# Patient Record
Sex: Female | Born: 1935 | Race: White | Hispanic: No
Health system: Southern US, Community
[De-identification: ages and names within clinical notes are randomized; demographics above are authoritative.]

---

## 2005-04-15 ENCOUNTER — Ambulatory Visit: Payer: Self-pay | Admitting: Internal Medicine

## 2005-04-23 ENCOUNTER — Ambulatory Visit: Payer: Self-pay | Admitting: Internal Medicine

## 2005-05-19 ENCOUNTER — Inpatient Hospital Stay: Payer: Self-pay | Admitting: Internal Medicine

## 2005-05-19 ENCOUNTER — Other Ambulatory Visit: Payer: Self-pay

## 2006-04-26 ENCOUNTER — Ambulatory Visit: Payer: Self-pay | Admitting: Internal Medicine

## 2009-02-20 ENCOUNTER — Ambulatory Visit: Payer: Self-pay | Admitting: Internal Medicine

## 2009-06-22 ENCOUNTER — Inpatient Hospital Stay: Payer: Self-pay | Admitting: Internal Medicine

## 2011-11-06 ENCOUNTER — Inpatient Hospital Stay: Payer: Self-pay | Admitting: Internal Medicine

## 2011-11-12 ENCOUNTER — Encounter: Payer: Self-pay | Admitting: Internal Medicine

## 2011-11-16 ENCOUNTER — Inpatient Hospital Stay: Payer: Self-pay | Admitting: Internal Medicine

## 2011-11-21 ENCOUNTER — Ambulatory Visit: Payer: Self-pay | Admitting: Urology

## 2011-11-28 ENCOUNTER — Encounter: Payer: Self-pay | Admitting: Internal Medicine

## 2011-12-29 ENCOUNTER — Encounter: Payer: Self-pay | Admitting: Internal Medicine

## 2012-01-29 ENCOUNTER — Encounter: Payer: Self-pay | Admitting: Internal Medicine

## 2012-01-29 ENCOUNTER — Inpatient Hospital Stay: Payer: Self-pay | Admitting: Internal Medicine

## 2012-01-29 ENCOUNTER — Ambulatory Visit: Payer: Self-pay | Admitting: Internal Medicine

## 2012-01-29 LAB — COMPREHENSIVE METABOLIC PANEL
Albumin: 3.3 g/dL — ABNORMAL LOW (ref 3.4–5.0)
Alkaline Phosphatase: 185 U/L — ABNORMAL HIGH (ref 50–136)
Anion Gap: 7 (ref 7–16)
Chloride: 100 mmol/L (ref 98–107)
Co2: 31 mmol/L (ref 21–32)
Creatinine: 0.89 mg/dL (ref 0.60–1.30)
EGFR (African American): >60
EGFR (Non-African Amer.): >60
Potassium: 4 mmol/L (ref 3.5–5.1)
SGOT(AST): 27 U/L (ref 15–37)
SGPT (ALT): 17 U/L
Sodium: 138 mmol/L (ref 136–145)
Total Protein: 7.4 g/dL (ref 6.4–8.2)

## 2012-01-29 LAB — CK TOTAL AND CKMB (NOT AT ARMC)
CK, Total: 20 U/L — ABNORMAL LOW (ref 21–215)
CK-MB: 0.6 ng/mL (ref 0.5–3.6)

## 2012-01-29 LAB — TROPONIN I: Troponin-I: <0.02 ng/mL

## 2012-01-29 LAB — URINALYSIS, COMPLETE
Bilirubin,UR: NEGATIVE
Blood: NEGATIVE
Ph: 8 (ref 4.5–8.0)
Protein: NEGATIVE
RBC,UR: 2 /HPF (ref 0–5)
Specific Gravity: 1.009 (ref 1.003–1.030)
Squamous Epithelial: 1
WBC UR: 34 /HPF (ref 0–5)

## 2012-01-29 LAB — CBC
HGB: 12.6 g/dL (ref 12.0–16.0)
MCH: 30 pg (ref 26.0–34.0)
MCHC: 33.1 g/dL (ref 32.0–36.0)
MCV: 91 fL (ref 80–100)
RBC: 4.2 10*6/uL (ref 3.80–5.20)

## 2012-01-30 ENCOUNTER — Ambulatory Visit: Payer: Self-pay | Admitting: Urology

## 2012-01-30 LAB — CBC WITH DIFFERENTIAL/PLATELET
Basophil #: 0 10*3/uL (ref 0.0–0.1)
Basophil %: 0.5 %
Eosinophil #: 0.2 10*3/uL (ref 0.0–0.7)
Eosinophil %: 3.6 %
HCT: 36.3 % (ref 35.0–47.0)
HGB: 12.1 g/dL (ref 12.0–16.0)
Lymphocyte #: 1.3 10*3/uL (ref 1.0–3.6)
Lymphocyte %: 19.3 %
MCH: 30.2 pg (ref 26.0–34.0)
MCHC: 33.4 g/dL (ref 32.0–36.0)
Monocyte #: 0.6 10*3/uL (ref 0.0–0.7)
Neutrophil %: 68 %
Platelet: 211 10*3/uL (ref 150–440)
RBC: 4.01 10*6/uL (ref 3.80–5.20)

## 2012-01-30 LAB — BASIC METABOLIC PANEL
Anion Gap: 8 (ref 7–16)
BUN: 20 mg/dL — ABNORMAL HIGH (ref 7–18)
Calcium, Total: 9 mg/dL (ref 8.5–10.1)
Chloride: 104 mmol/L (ref 98–107)
Creatinine: 0.72 mg/dL (ref 0.60–1.30)
EGFR (Non-African Amer.): >60
Glucose: 95 mg/dL (ref 65–99)
Osmolality: 286 (ref 275–301)
Potassium: 3.8 mmol/L (ref 3.5–5.1)
Sodium: 142 mmol/L (ref 136–145)

## 2012-01-30 LAB — FOLATE: Folic Acid: 13.8 ng/mL (ref 3.1–100.0)

## 2012-01-30 LAB — SEDIMENTATION RATE: Erythrocyte Sed Rate: 28 mm/hr (ref 0–30)

## 2012-01-31 LAB — COMPREHENSIVE METABOLIC PANEL
Albumin: 2.8 g/dL — ABNORMAL LOW (ref 3.4–5.0)
Anion Gap: 11 (ref 7–16)
BUN: 25 mg/dL — ABNORMAL HIGH (ref 7–18)
Co2: 29 mmol/L (ref 21–32)
EGFR (African American): >60
Glucose: 88 mg/dL (ref 65–99)
Osmolality: 294 (ref 275–301)
Potassium: 3.7 mmol/L (ref 3.5–5.1)
SGOT(AST): 21 U/L (ref 15–37)
Sodium: 146 mmol/L — ABNORMAL HIGH (ref 136–145)
Total Protein: 6.1 g/dL — ABNORMAL LOW (ref 6.4–8.2)

## 2012-01-31 LAB — CBC WITH DIFFERENTIAL/PLATELET
Basophil #: 0 10*3/uL (ref 0.0–0.1)
HCT: 35.3 % (ref 35.0–47.0)
HGB: 11.6 g/dL — ABNORMAL LOW (ref 12.0–16.0)
Lymphocyte #: 1.5 10*3/uL (ref 1.0–3.6)
Lymphocyte %: 25.2 %
MCHC: 32.9 g/dL (ref 32.0–36.0)
MCV: 90 fL (ref 80–100)
Monocyte %: 7.8 %
Neutrophil #: 3.6 10*3/uL (ref 1.4–6.5)
RBC: 3.92 10*6/uL (ref 3.80–5.20)
RDW: 14.8 % — ABNORMAL HIGH (ref 11.5–14.5)

## 2012-02-02 LAB — CBC WITH DIFFERENTIAL/PLATELET
Basophil %: 0.8 %
Eosinophil #: 0.5 10*3/uL (ref 0.0–0.7)
Eosinophil %: 7.4 %
HCT: 35.1 % (ref 35.0–47.0)
HGB: 11.6 g/dL — ABNORMAL LOW (ref 12.0–16.0)
Lymphocyte #: 1.6 10*3/uL (ref 1.0–3.6)
Lymphocyte %: 26.2 %
MCHC: 33.2 g/dL (ref 32.0–36.0)
MCV: 90 fL (ref 80–100)
Monocyte #: 0.6 10*3/uL (ref 0.0–0.7)
Neutrophil #: 3.5 10*3/uL (ref 1.4–6.5)
RBC: 3.9 10*6/uL (ref 3.80–5.20)

## 2012-02-02 LAB — APTT: Activated PTT: 42.1 secs — ABNORMAL HIGH (ref 23.6–35.9)

## 2012-02-02 LAB — PROTIME-INR: Prothrombin Time: 13.2 secs (ref 11.5–14.7)

## 2012-02-03 LAB — BASIC METABOLIC PANEL
Anion Gap: 8 (ref 7–16)
Chloride: 102 mmol/L (ref 98–107)
Co2: 32 mmol/L (ref 21–32)
Creatinine: 0.83 mg/dL (ref 0.60–1.30)
EGFR (Non-African Amer.): >60
Osmolality: 288 (ref 275–301)
Potassium: 3.5 mmol/L (ref 3.5–5.1)
Sodium: 142 mmol/L (ref 136–145)

## 2012-02-03 LAB — MAGNESIUM: Magnesium: 2.5 mg/dL — ABNORMAL HIGH

## 2012-02-03 LAB — APTT: Activated PTT: 54.7 secs — ABNORMAL HIGH (ref 23.6–35.9)

## 2012-02-04 LAB — HEMOGLOBIN: HGB: 11.3 g/dL — ABNORMAL LOW (ref 12.0–16.0)

## 2012-02-26 ENCOUNTER — Ambulatory Visit: Payer: Self-pay | Admitting: Internal Medicine

## 2013-09-29 ENCOUNTER — Inpatient Hospital Stay: Payer: Self-pay | Admitting: Internal Medicine

## 2013-09-29 LAB — COMPREHENSIVE METABOLIC PANEL
Albumin: 3.7 g/dL (ref 3.4–5.0)
Alkaline Phosphatase: 130 U/L (ref 50–136)
Bilirubin,Total: 0.8 mg/dL (ref 0.2–1.0)
Calcium, Total: 9.1 mg/dL (ref 8.5–10.1)
Co2: 30 mmol/L (ref 21–32)
Creatinine: 0.98 mg/dL (ref 0.60–1.30)
EGFR (Non-African Amer.): 49 — ABNORMAL LOW
SGPT (ALT): 23 U/L (ref 12–78)
Sodium: 142 mmol/L (ref 136–145)

## 2013-09-29 LAB — CBC
HGB: 14.5 g/dL (ref 12.0–16.0)
MCH: 32.9 pg (ref 26.0–34.0)
MCV: 94 fL (ref 80–100)
Platelet: 134 10*3/uL — ABNORMAL LOW (ref 150–440)
WBC: 4.9 10*3/uL (ref 3.6–11.0)

## 2013-09-29 LAB — URINALYSIS, COMPLETE
Bilirubin,UR: NEGATIVE
Blood: NEGATIVE
Glucose,UR: NEGATIVE mg/dL (ref 0–75)
Ketone: NEGATIVE
Protein: 100
RBC,UR: 3 /HPF (ref 0–5)
Specific Gravity: 1.018 (ref 1.003–1.030)
Squamous Epithelial: NONE SEEN

## 2013-09-29 LAB — TROPONIN I: Troponin-I: <0.02 ng/mL

## 2013-09-30 LAB — CBC WITH DIFFERENTIAL/PLATELET
Eosinophil %: 1.6 %
HGB: 13.4 g/dL (ref 12.0–16.0)
Lymphocyte %: 22.3 %
Monocyte #: 0.5 x10 3/mm (ref 0.2–0.9)
Neutrophil %: 65.6 %
Platelet: 124 10*3/uL — ABNORMAL LOW (ref 150–440)
RBC: 4.13 10*6/uL (ref 3.80–5.20)
WBC: 5 10*3/uL (ref 3.6–11.0)

## 2013-09-30 LAB — BASIC METABOLIC PANEL
Anion Gap: 4 — ABNORMAL LOW (ref 7–16)
Creatinine: 1.08 mg/dL (ref 0.60–1.30)
Osmolality: 285 (ref 275–301)
Sodium: 142 mmol/L (ref 136–145)

## 2013-10-01 LAB — BASIC METABOLIC PANEL
Anion Gap: 2 — ABNORMAL LOW (ref 7–16)
Chloride: 104 mmol/L (ref 98–107)
Creatinine: 1.05 mg/dL (ref 0.60–1.30)
Glucose: 97 mg/dL (ref 65–99)
Potassium: 3.8 mmol/L (ref 3.5–5.1)
Sodium: 138 mmol/L (ref 136–145)

## 2013-10-01 LAB — CBC WITH DIFFERENTIAL/PLATELET
Basophil #: 0 10*3/uL (ref 0.0–0.1)
Basophil %: 0.6 %
Eosinophil #: 0.1 10*3/uL (ref 0.0–0.7)
Lymphocyte #: 1.3 10*3/uL (ref 1.0–3.6)
Lymphocyte %: 21.9 %
MCHC: 34.8 g/dL (ref 32.0–36.0)
Monocyte %: 9.3 %
Neutrophil #: 3.9 10*3/uL (ref 1.4–6.5)
Neutrophil %: 66.3 %
Platelet: 126 10*3/uL — ABNORMAL LOW (ref 150–440)
RDW: 13.1 % (ref 11.5–14.5)
WBC: 5.8 10*3/uL (ref 3.6–11.0)

## 2013-10-01 LAB — URINE CULTURE

## 2013-10-02 LAB — URINALYSIS, COMPLETE
Bacteria: NONE SEEN
Glucose,UR: NEGATIVE mg/dL (ref 0–75)
Hyaline Cast: 15
Ketone: NEGATIVE
Protein: 100
RBC,UR: 3620 /HPF (ref 0–5)
Squamous Epithelial: <1

## 2013-10-02 LAB — BASIC METABOLIC PANEL
Calcium, Total: 9.2 mg/dL (ref 8.5–10.1)
Chloride: 101 mmol/L (ref 98–107)
Creatinine: 1.13 mg/dL (ref 0.60–1.30)
EGFR (Non-African Amer.): 42 — ABNORMAL LOW
Potassium: 3.5 mmol/L (ref 3.5–5.1)

## 2013-10-04 LAB — BASIC METABOLIC PANEL
Anion Gap: 5 — ABNORMAL LOW (ref 7–16)
BUN: 27 mg/dL — ABNORMAL HIGH (ref 7–18)
Calcium, Total: 9.4 mg/dL (ref 8.5–10.1)
Chloride: 103 mmol/L (ref 98–107)
Co2: 32 mmol/L (ref 21–32)
Creatinine: 0.98 mg/dL (ref 0.60–1.30)
EGFR (African American): 57 — ABNORMAL LOW
EGFR (Non-African Amer.): 49 — ABNORMAL LOW
Osmolality: 286 (ref 275–301)
Sodium: 140 mmol/L (ref 136–145)

## 2013-10-04 LAB — CBC WITH DIFFERENTIAL/PLATELET
Basophil #: 0 10*3/uL (ref 0.0–0.1)
Eosinophil %: 0.2 %
HCT: 40.3 % (ref 35.0–47.0)
MCHC: 35 g/dL (ref 32.0–36.0)
Monocyte #: 0.2 x10 3/mm (ref 0.2–0.9)
Neutrophil #: 3.6 10*3/uL (ref 1.4–6.5)
RBC: 4.33 10*6/uL (ref 3.80–5.20)
WBC: 4.4 10*3/uL (ref 3.6–11.0)

## 2013-10-04 LAB — CULTURE, BLOOD (SINGLE)

## 2013-10-05 ENCOUNTER — Ambulatory Visit: Payer: Self-pay | Admitting: Hospice and Palliative Medicine

## 2013-10-05 LAB — BASIC METABOLIC PANEL
Anion Gap: 4 — ABNORMAL LOW (ref 7–16)
BUN: 42 mg/dL — ABNORMAL HIGH (ref 7–18)
Calcium, Total: 9.3 mg/dL (ref 8.5–10.1)
Chloride: 103 mmol/L (ref 98–107)
Co2: 31 mmol/L (ref 21–32)
Creatinine: 1.1 mg/dL (ref 0.60–1.30)
EGFR (African American): 50 — ABNORMAL LOW
Glucose: 132 mg/dL — ABNORMAL HIGH (ref 65–99)
Osmolality: 288 (ref 275–301)
Potassium: 4 mmol/L (ref 3.5–5.1)

## 2013-10-05 LAB — CBC WITH DIFFERENTIAL/PLATELET
Basophil %: 0.1 %
Eosinophil #: 0 10*3/uL (ref 0.0–0.7)
Eosinophil %: 0 %
Lymphocyte %: 9.3 %
MCH: 32.5 pg (ref 26.0–34.0)
Neutrophil #: 5.4 10*3/uL (ref 1.4–6.5)
Neutrophil %: 83.8 %
Platelet: 141 10*3/uL — ABNORMAL LOW (ref 150–440)
RBC: 3.96 10*6/uL (ref 3.80–5.20)
WBC: 6.4 10*3/uL (ref 3.6–11.0)

## 2013-10-05 LAB — TROPONIN I: Troponin-I: <0.02 ng/mL

## 2013-10-05 LAB — CK TOTAL AND CKMB (NOT AT ARMC)
CK, Total: 20 U/L — ABNORMAL LOW (ref 21–215)
CK-MB: 1.5 ng/mL (ref 0.5–3.6)

## 2013-10-06 LAB — BASIC METABOLIC PANEL
Anion Gap: 5 — ABNORMAL LOW (ref 7–16)
Chloride: 103 mmol/L (ref 98–107)
Co2: 30 mmol/L (ref 21–32)
EGFR (African American): >60
EGFR (Non-African Amer.): 55 — ABNORMAL LOW
Glucose: 100 mg/dL — ABNORMAL HIGH (ref 65–99)
Osmolality: 284 (ref 275–301)
Sodium: 138 mmol/L (ref 136–145)

## 2013-10-07 LAB — CBC WITH DIFFERENTIAL/PLATELET
Basophil #: 0 10*3/uL (ref 0.0–0.1)
Basophil %: 0.2 %
Eosinophil %: 0.4 %
HCT: 39.3 % (ref 35.0–47.0)
Lymphocyte #: 0.6 10*3/uL — ABNORMAL LOW (ref 1.0–3.6)
MCHC: 35 g/dL (ref 32.0–36.0)
MCV: 93 fL (ref 80–100)
Monocyte #: 0.4 x10 3/mm (ref 0.2–0.9)
Neutrophil %: 79 %
RBC: 4.22 10*6/uL (ref 3.80–5.20)
RDW: 13.2 % (ref 11.5–14.5)
WBC: 5.2 10*3/uL (ref 3.6–11.0)

## 2013-10-07 LAB — BASIC METABOLIC PANEL
Chloride: 104 mmol/L (ref 98–107)
Creatinine: 0.99 mg/dL (ref 0.60–1.30)
EGFR (African American): 57 — ABNORMAL LOW
EGFR (Non-African Amer.): 49 — ABNORMAL LOW
Osmolality: 287 (ref 275–301)
Potassium: 4.3 mmol/L (ref 3.5–5.1)

## 2013-10-09 LAB — BASIC METABOLIC PANEL
Calcium, Total: 9.2 mg/dL (ref 8.5–10.1)
Chloride: 104 mmol/L (ref 98–107)
EGFR (Non-African Amer.): 53 — ABNORMAL LOW
Glucose: 111 mg/dL — ABNORMAL HIGH (ref 65–99)
Osmolality: 285 (ref 275–301)

## 2013-10-28 ENCOUNTER — Ambulatory Visit: Payer: Self-pay | Admitting: Nurse Practitioner

## 2013-11-27 ENCOUNTER — Ambulatory Visit: Payer: Self-pay | Admitting: Nurse Practitioner

## 2013-12-28 DEATH — deceased

## 2014-07-23 IMAGING — CR DG CHEST 1V PORT
1 series · 2 of 2 positions shown · non-contrast
Comparison: none

REASON FOR EXAM: difficulty breathing
COMMENTS:

[Series 1: ap · 0.17mm/px · 2 of 2 slices shown]
[im 1/2]
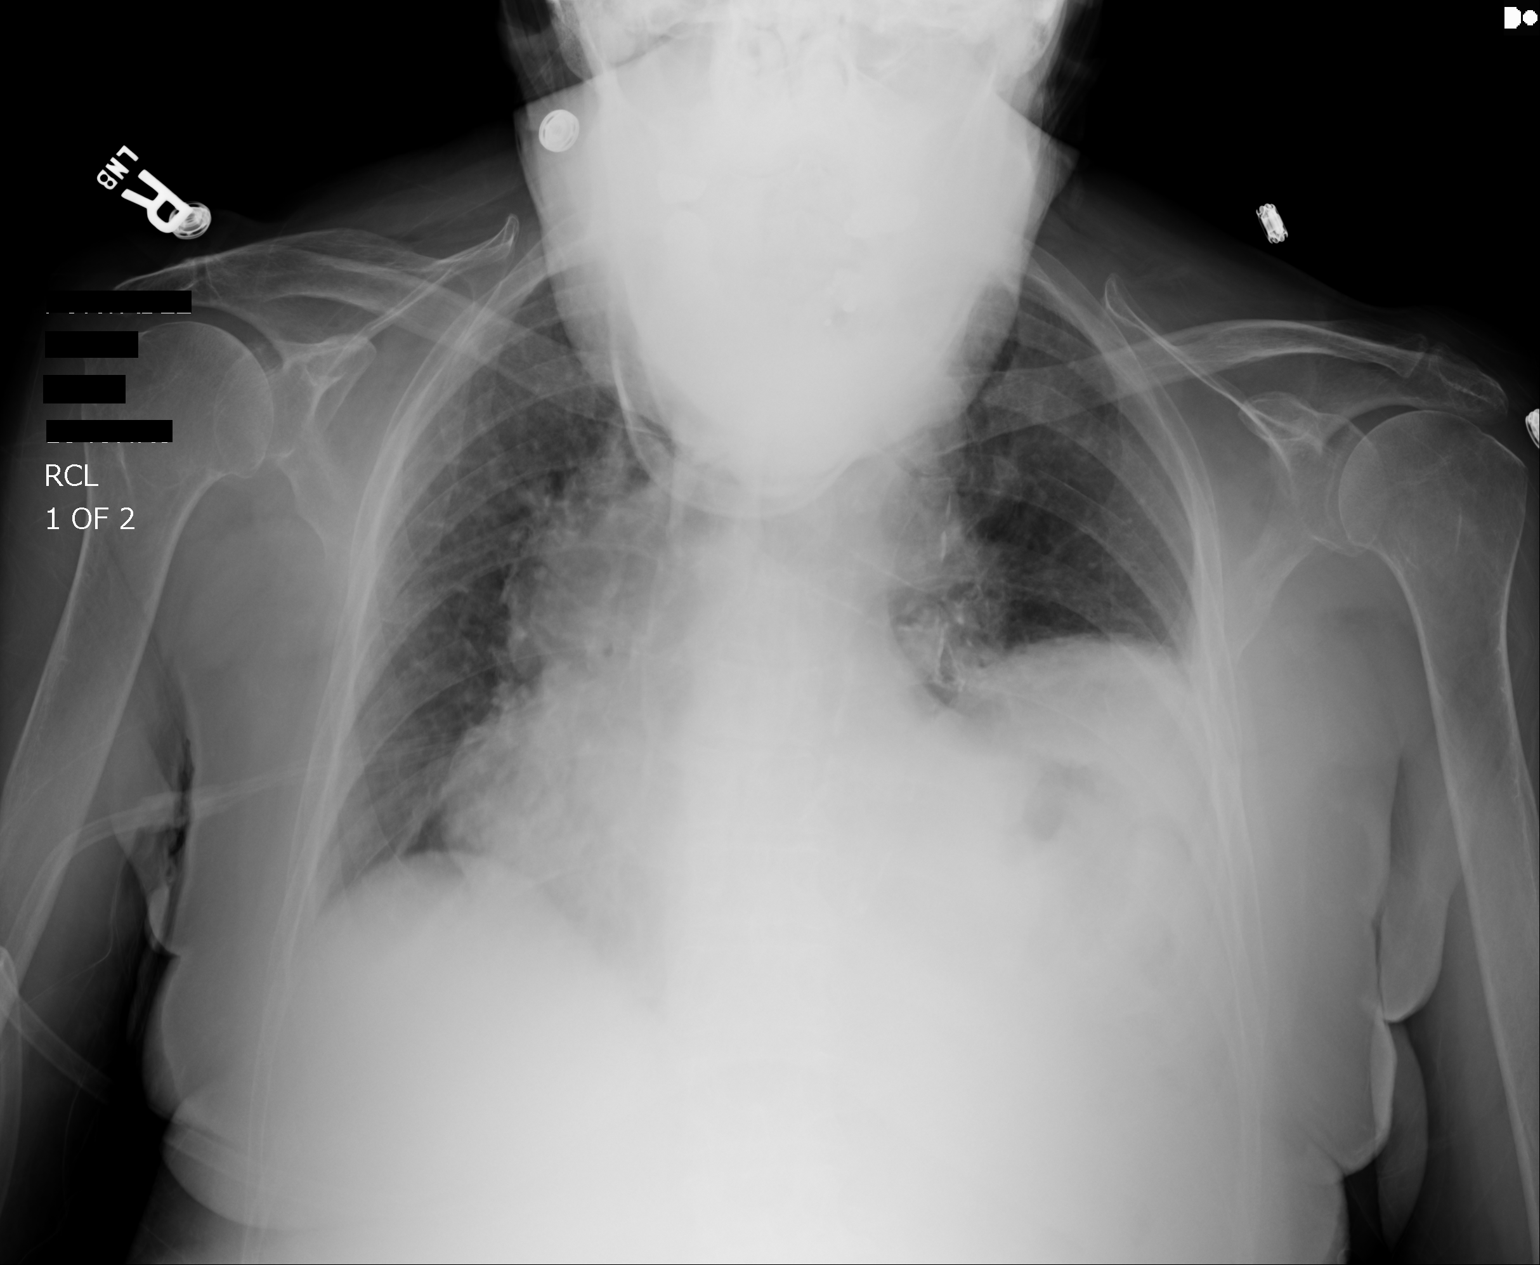
[im 2/2]
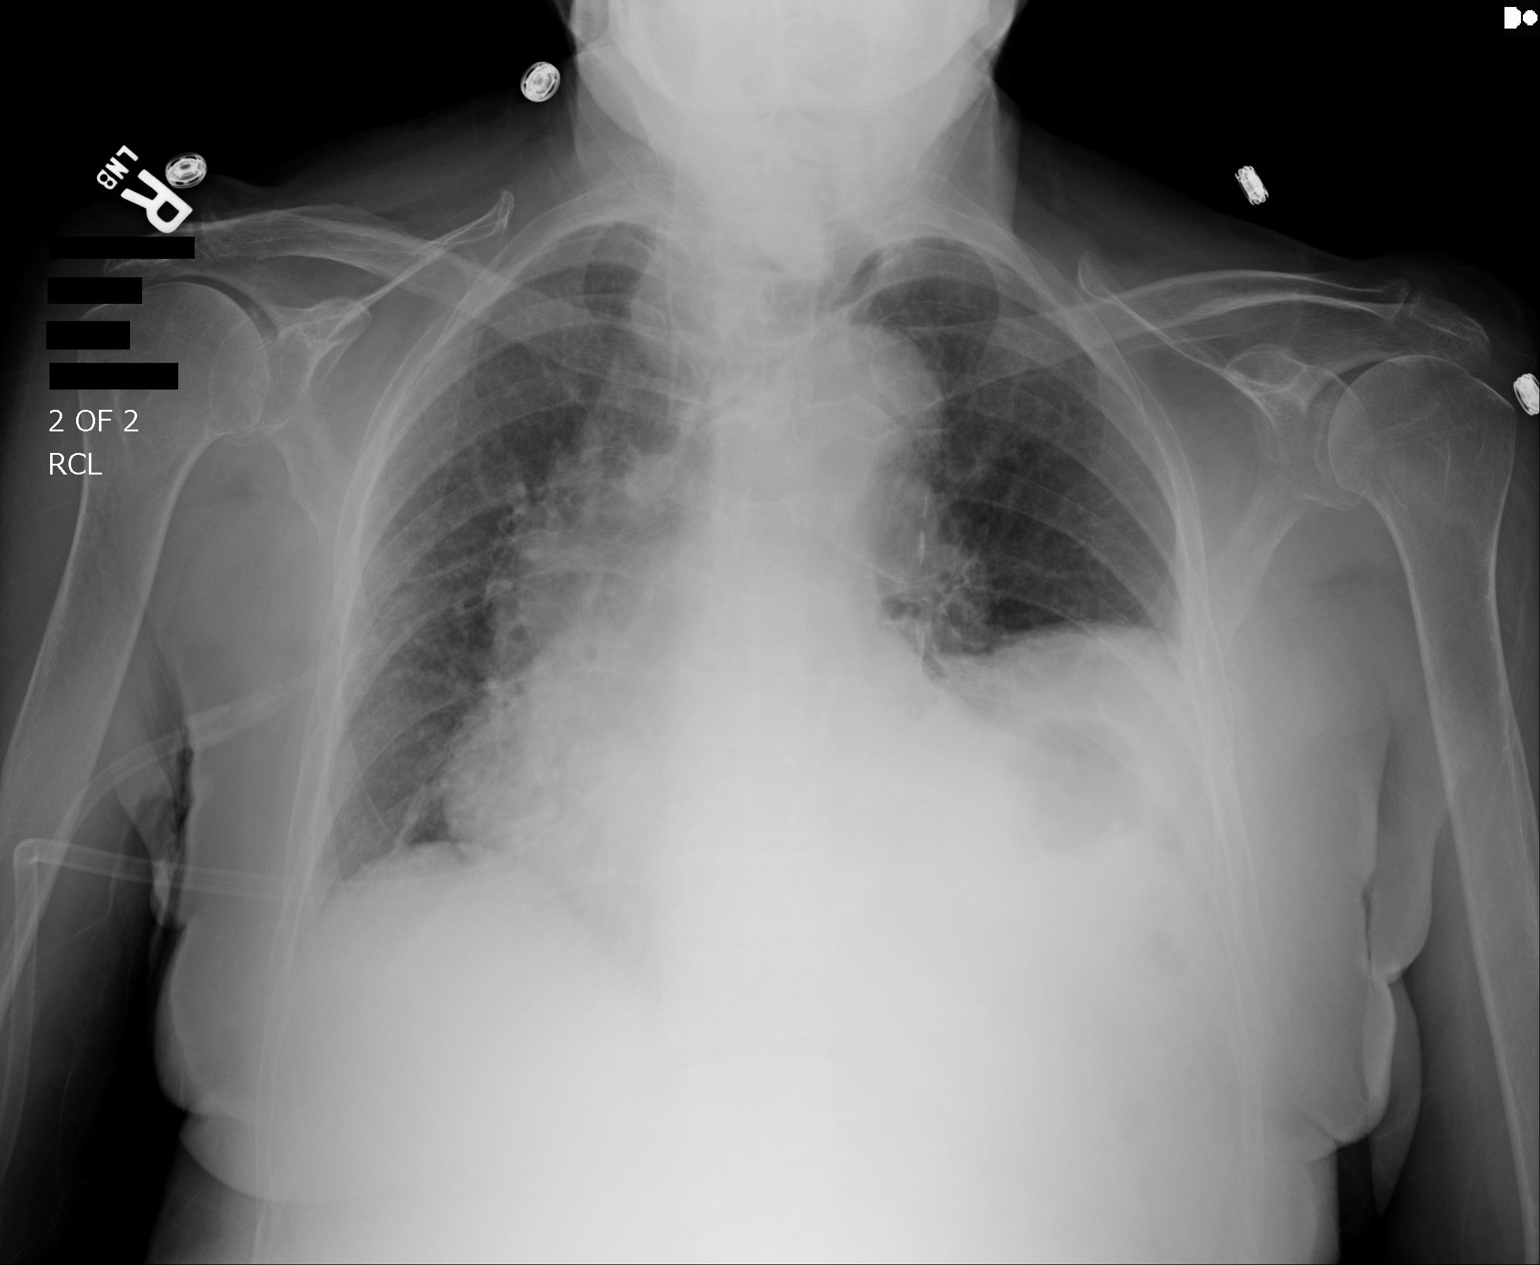

[2 of 2 positions shown; findings below may reference images not displayed]

PROCEDURE:     DXR - DXR PORTABLE CHEST SINGLE VIEW  - October 03, 2013  [DATE]

RESULT:     The chin projects over the lung apices and superior mediastinum.
There is elevation of the left side of the diaphragm. The heart is enlarged.
There is perihilar increased density bilaterally especially on the right. A
second image shows the chin is raised. There is no pneumothorax. There is no
large effusion. The bones are osteopenic.
IMPRESSION: 1. Cardiomegaly. Stable appearance. Elevation of the left side of the
diaphragm. Hilar fullness bilaterally.

[REDACTED]

## 2014-07-25 IMAGING — US US EXTREM LOW VENOUS BILAT
1 series · 14 of 24 positions shown · non-contrast
Comparison: none

REASON FOR EXAM: sob, hypoxia, ? dvt
COMMENTS:

[Series 1: us extrem low venous bilat · 0.10mm/px · 14 of 50 slices shown]
[im 1/50]
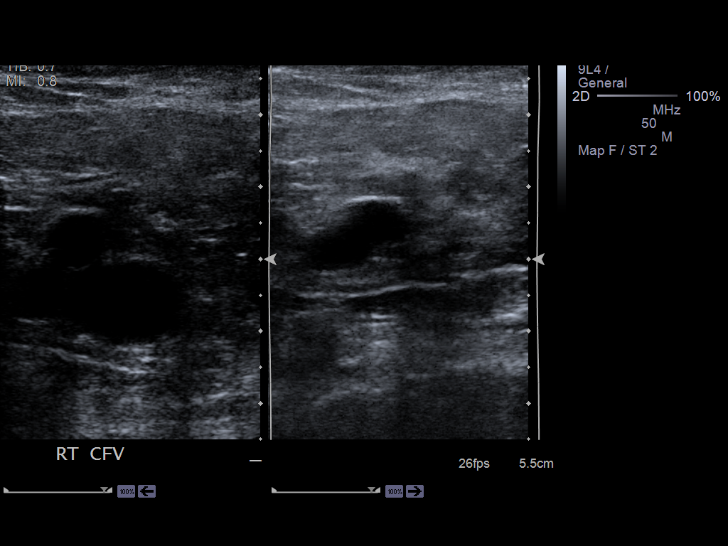
[im 5/50]
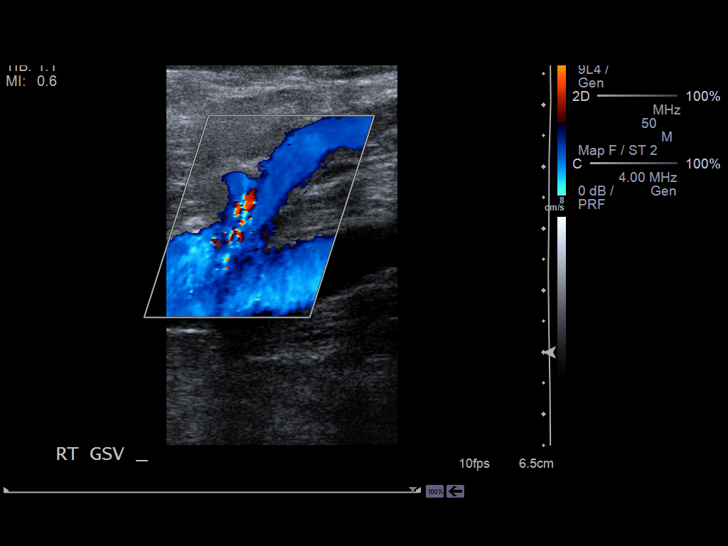
[im 9/50]
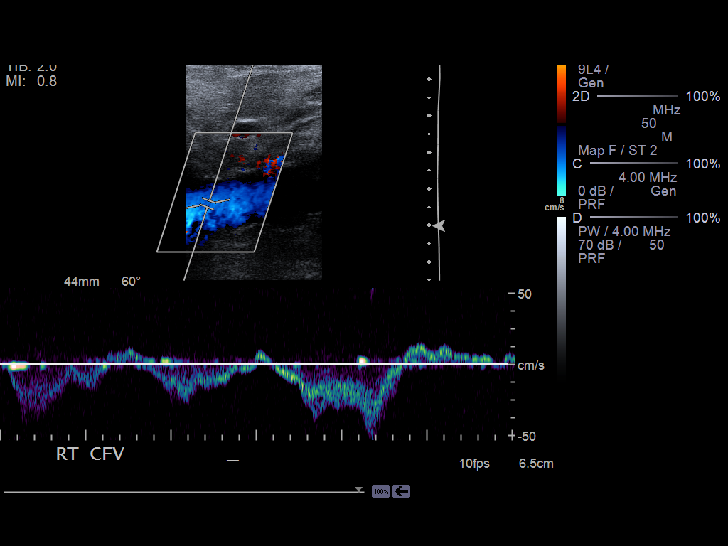
[im 13/50]
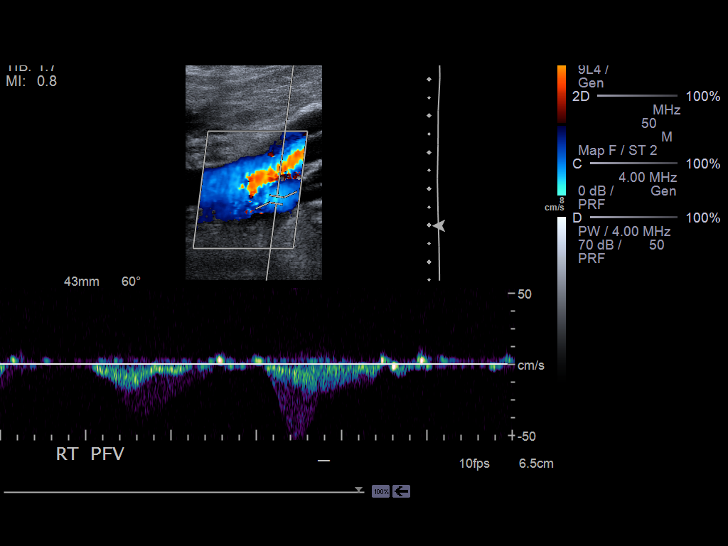
[im 15/50]
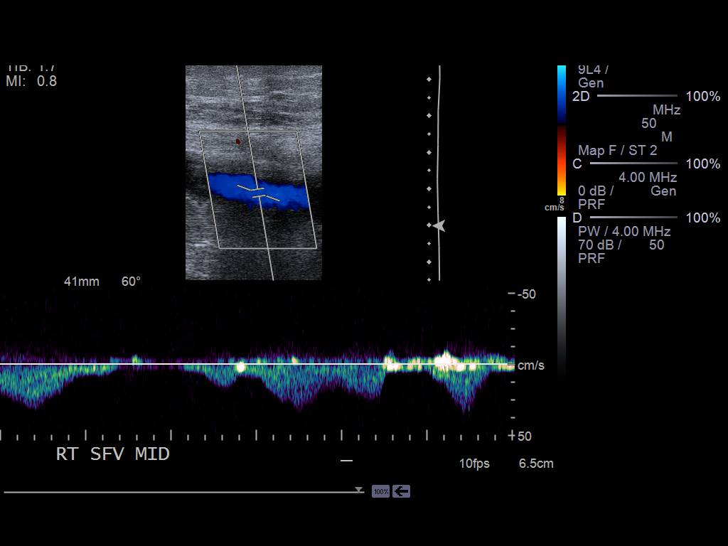
[im 20/50]
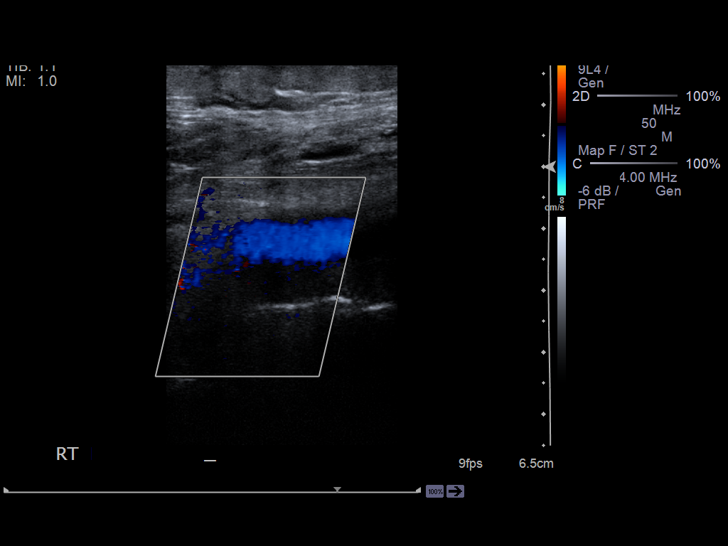
[im 24/50]
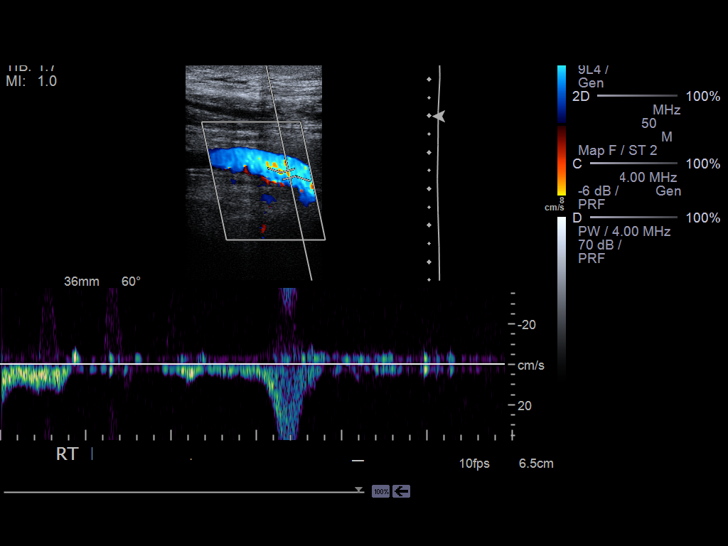
[im 26/50]
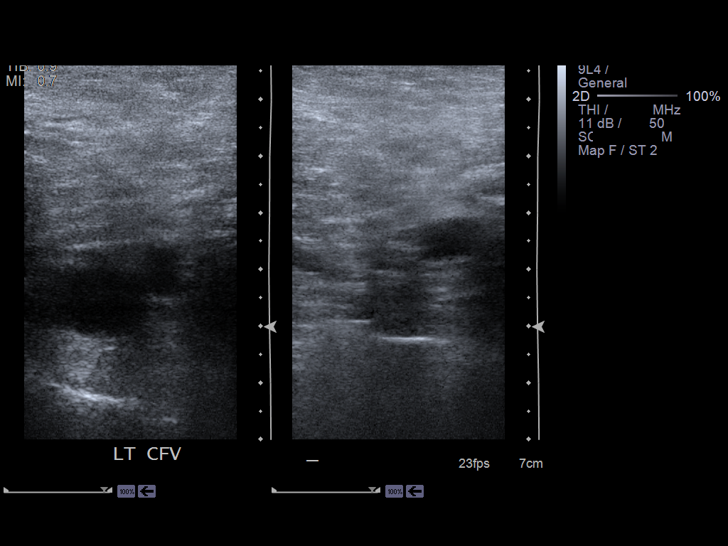
[im 30/50]
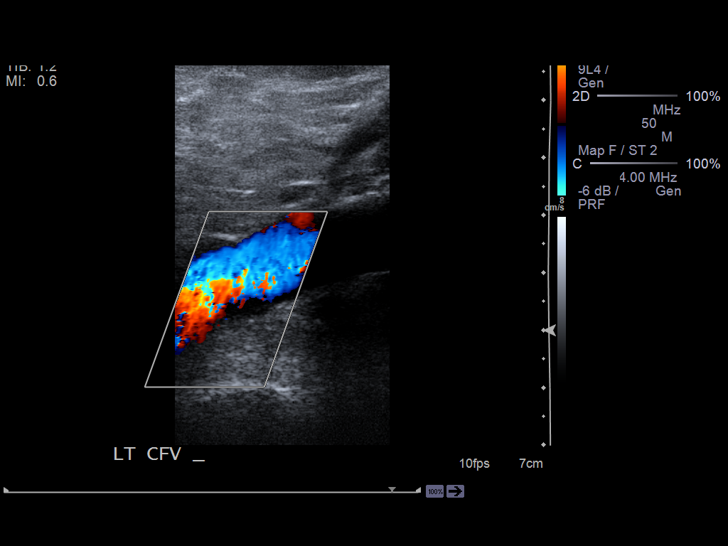
[im 35/50]
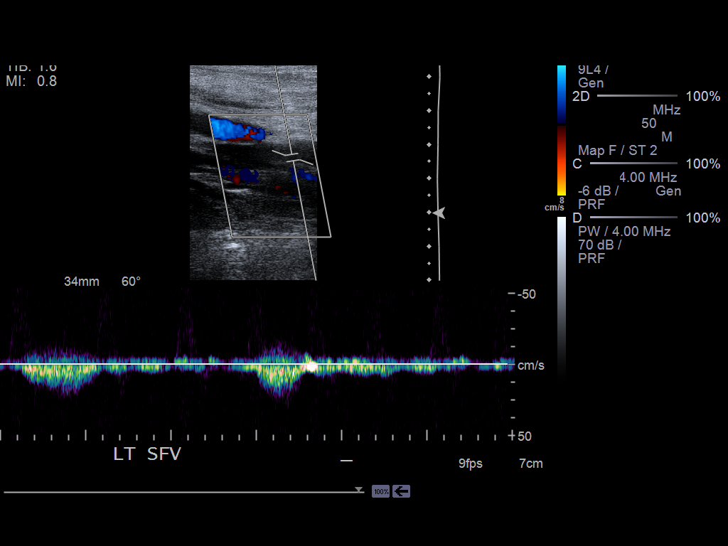
[im 39/50]
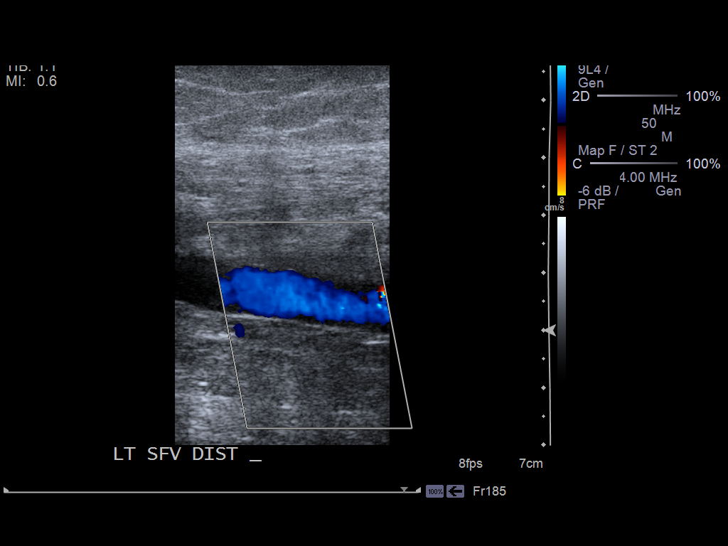
[im 41/50]
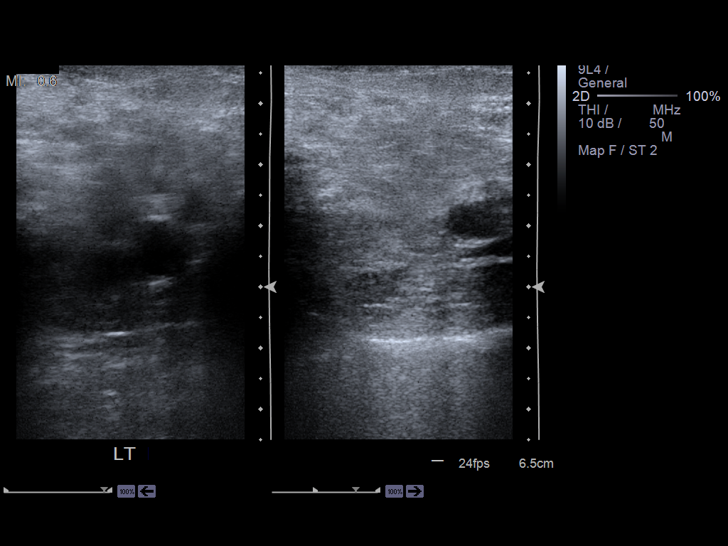
[im 45/50]
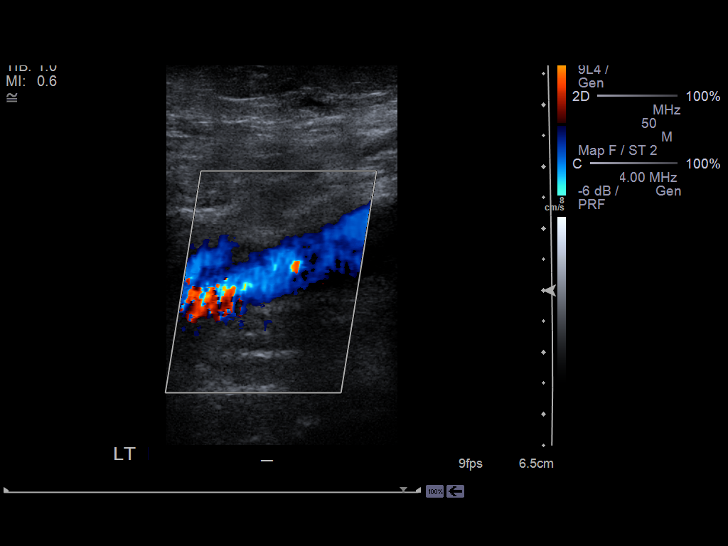
[im 50/50]
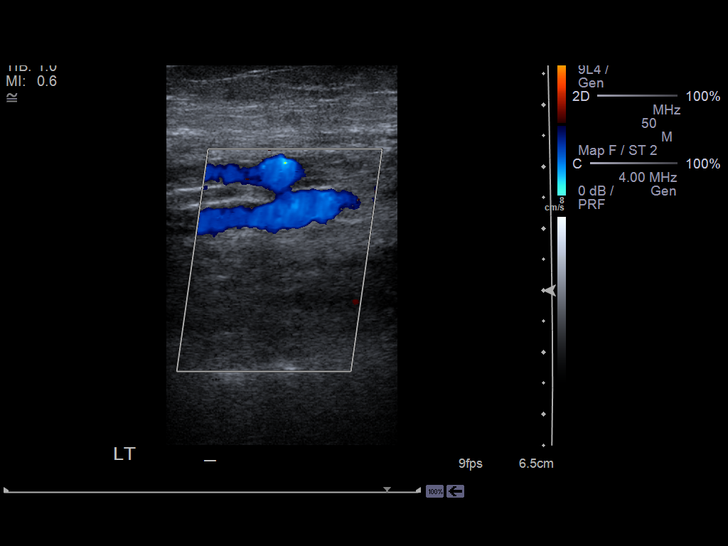

[14 of 24 positions shown; findings below may reference images not displayed]

PROCEDURE:     US  - US DOPPLER LOW EXTR BILATERAL  - October 05, 2013 [DATE]

RESULT:     Duplex Doppler interrogation of the deep venous system of both
legs from the inguinal to the popliteal region demonstrates the deep venous
systems are fully compressible throughout. The color Doppler and spectral
Doppler appearance is normal. There is normal response to distal
augmentation. The color Doppler images show no filling defect.
IMPRESSION: 1. No evidence of DVT in either lower extremity.

[REDACTED]

## 2014-07-26 IMAGING — CT CT ANGIO CHEST
1 series · 19 of 32 positions shown · IV contrast (APPLIED)
Comparison: None

REASON FOR EXAM: resp failure/pleuritic chest pain
COMMENTS:

PROCEDURE:     CT  - CT ANGIOGRAPHY CHEST W FOR PE  - October 06, 2013  [DATE]
RESULT:     Indications: Chest Pain
TECHNIQUE: A thin-section spiral CT from the lung apices to the upper
abdomen was acquired on a multi slice scanner following 100ml [id number]
intravenous contrast. These images were then transferred to the Siemens work
station and were subsequently reviewed utilizing 3-D reconstructions and MIP
images.

[Series 4: soft tissue · axial · 0.57mm/px · z∈[-766,-524]mm · 19 of 87 slices shown]
[im 3/87  lung]
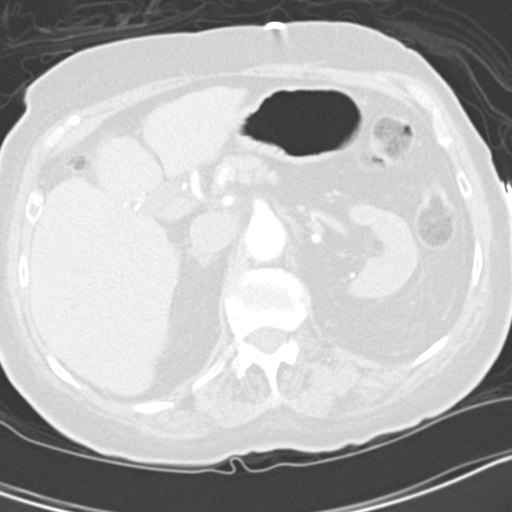
[im 9/87  soft-tissue]
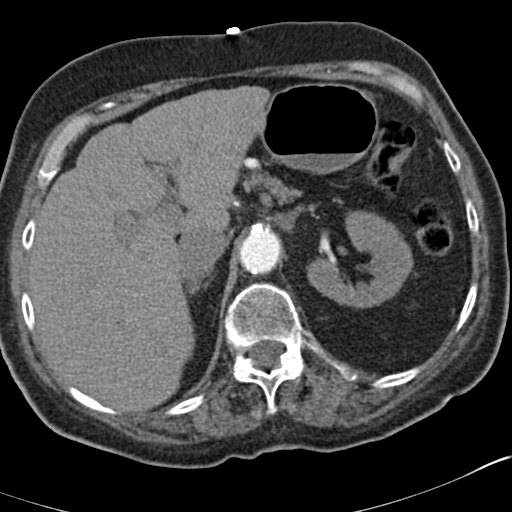
[im 12/87  lung]
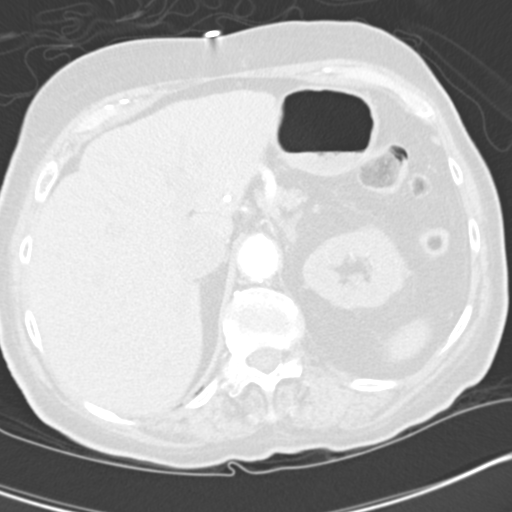
[im 17/87  soft-tissue]
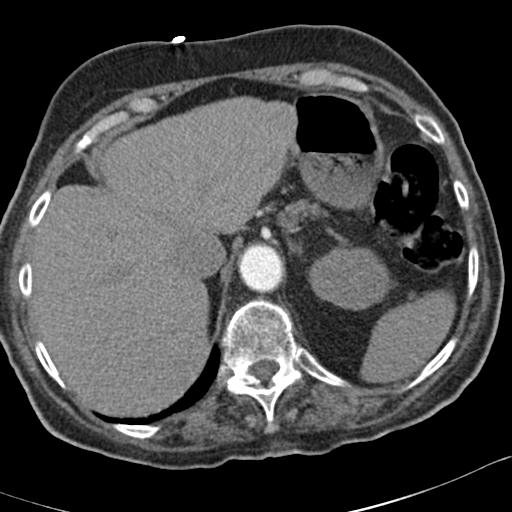
[im 23/87  lung]
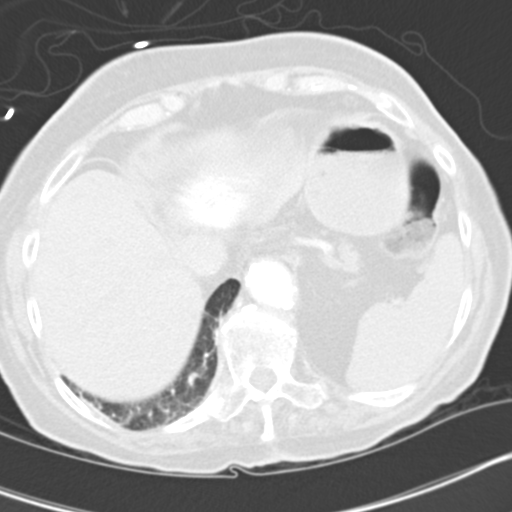
[im 25/87  soft-tissue]
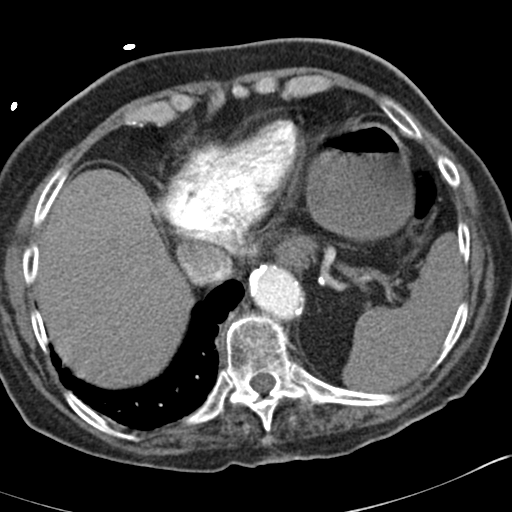
[im 31/87  lung]
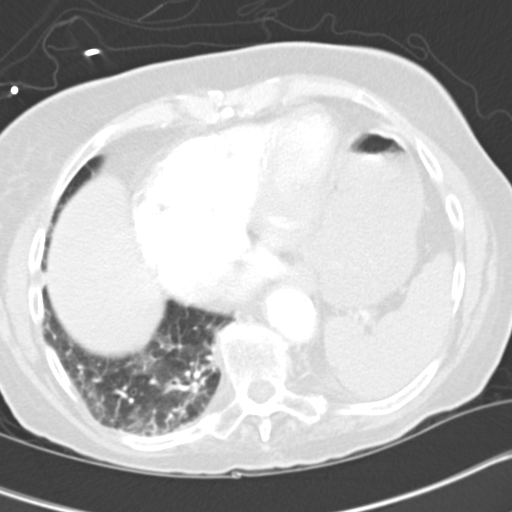
[im 34/87  soft-tissue]
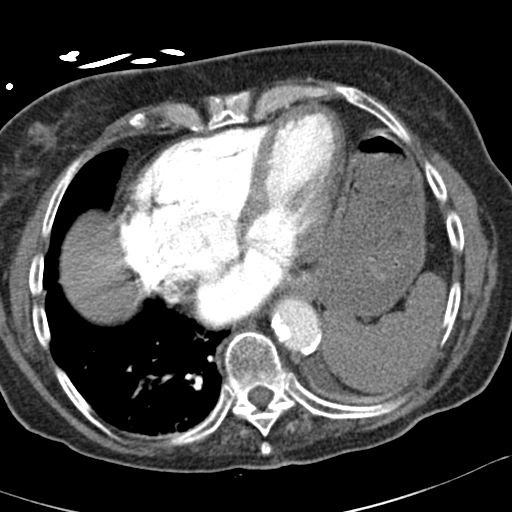
[im 39/87  lung]
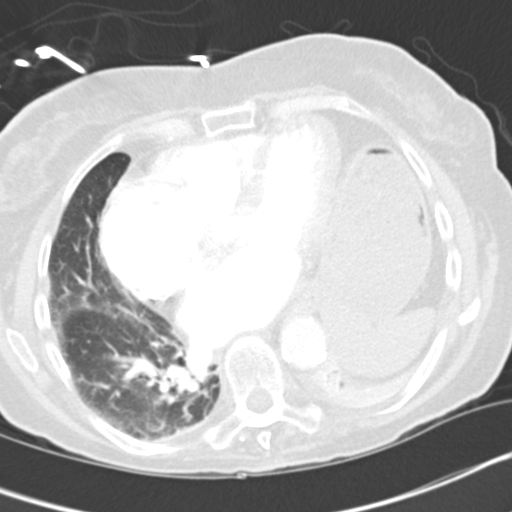
[im 45/87  soft-tissue]
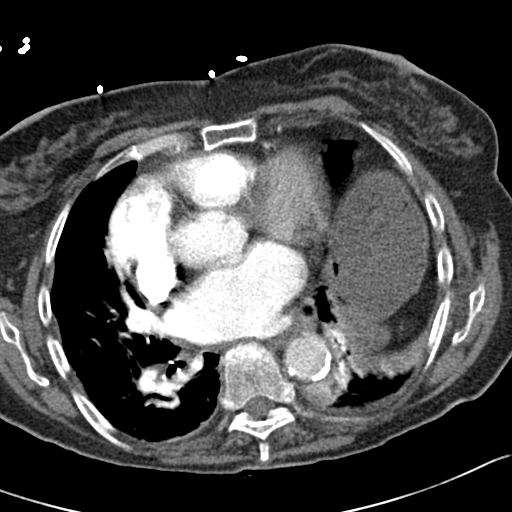
[im 48/87  lung]
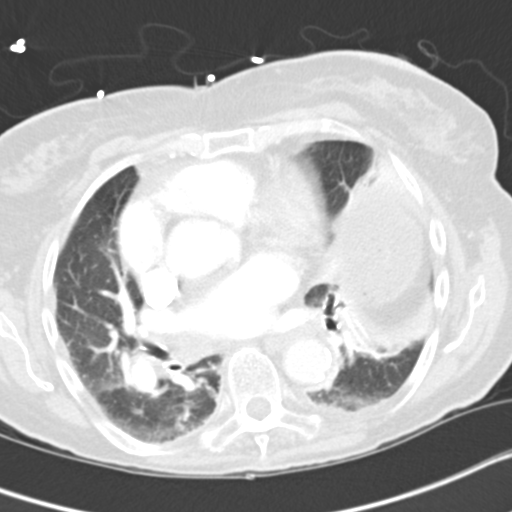
[im 53/87  soft-tissue]
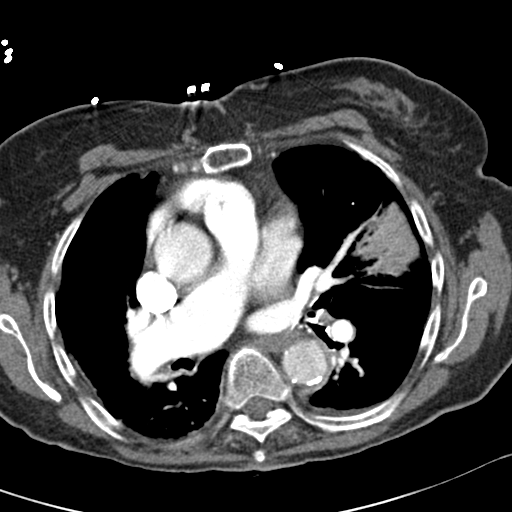
[im 56/87  lung]
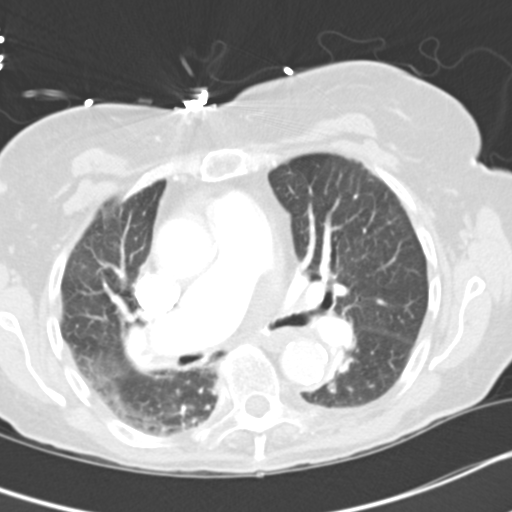
[im 62/87  soft-tissue]
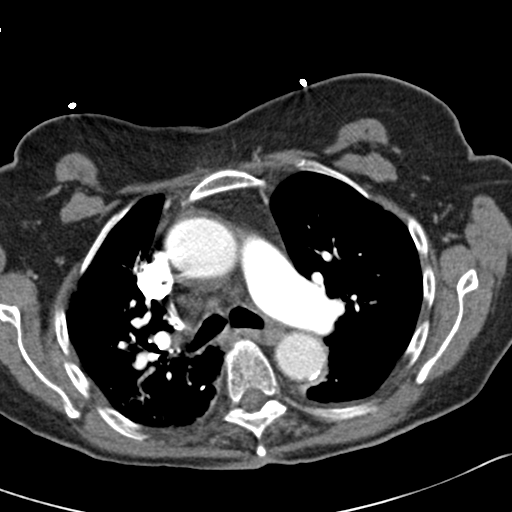
[im 64/87  lung]
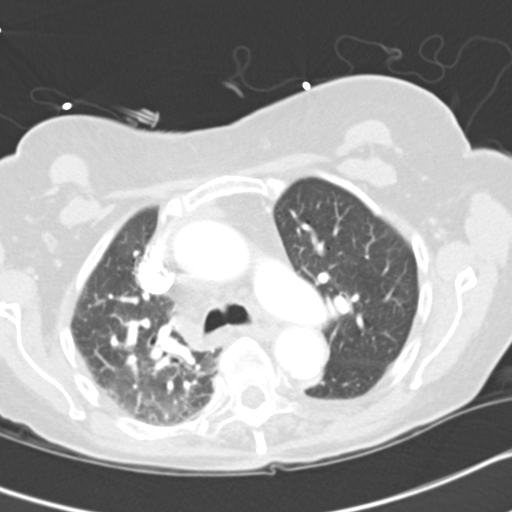
[im 70/87  soft-tissue]
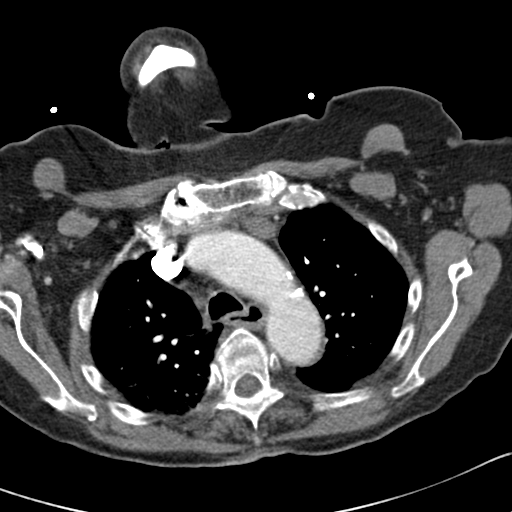
[im 75/87  lung]
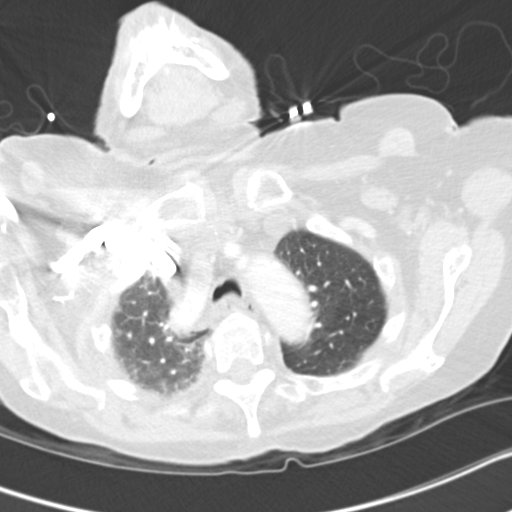
[im 78/87  soft-tissue]
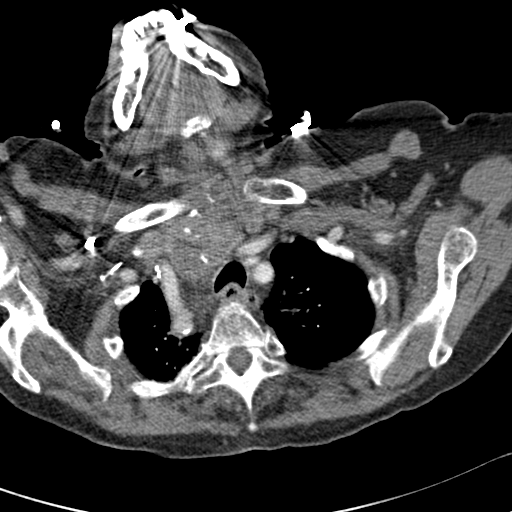
[im 84/87  lung]
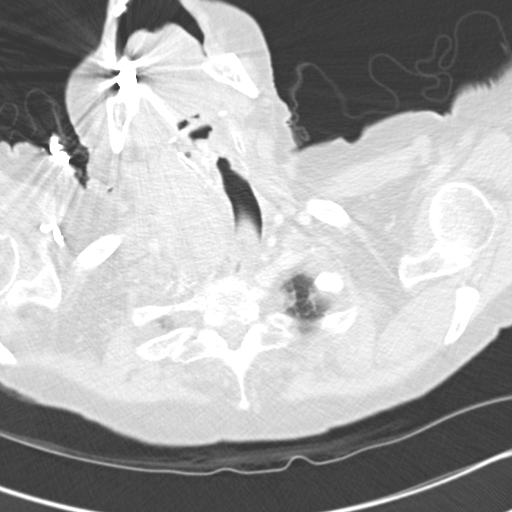

[19 of 32 positions shown; findings below may reference images not displayed]

FINDINGS: There is adequate opacification of the pulmonary arteries. There is no
pulmonary embolus. The main pulmonary arteries are dilated as can be seen
with pulmonary arterial hypertension. The heart size is enlarged. There is
no pericardial effusion.

There is elevation of the left diaphragm. There is a left basilar
atelectasis. There is a trace left pleural effusion.

There is a large heterogeneous right thyroid gland measuring approximately
6.5 x 4.3 cm similar in appearance to 11/16/2011 weighted mass effect upon
the trachea which is deviated towards the left. This likely represents a
thyroid goiter.

There is no axillary, hilar, or mediastinal adenopathy.

The osseous structures are unremarkable.

The visualized portions of the upper abdomen are unremarkable.
IMPRESSION: 1. No CT evidence of pulmonary embolus.

2. There is a large heterogeneous right thyroid gland measuring
approximately 6.5 x 4.3 cm similar in appearance to 11/16/2011 weighted mass
effect upon the trachea which is deviated towards the left. This likely
represents a thyroid goiter.

[REDACTED]

## 2015-04-19 NOTE — H&P (Signed)
   Subjective/Chief Complaint Gross Hematuria   History of Present Illness 81 yo female presents with UTI.  Noted to have gross hematuria so urology consulted.  She states that she has had UTI's in the past.  Unsure if she has ever had blood.  Denies abdominal or flank pain.   Past Med/Surgical Hx:  urinary tract infection:   Hypertension:   Hypothyroidism:   Hysterectomy - Total:   Cholecystectomy:   Appendectomy:   ALLERGIES:  Lodine: Swelling  Sulfa drugs: Other  Family and Social History:  Family History Non-Contributory   Social History negative tobacco, negative ETOH   Review of Systems:  Subjective/Chief Complaint Gross hematuria   Fever/Chills No   Cough No   Sputum No   Abdominal Pain No   Diarrhea No   Constipation No   Nausea/Vomiting No   SOB/DOE No   Chest Pain No   Dysuria No  Foley in place   Medications/Allergies Reviewed Medications/Allergies reviewed   Physical Exam:  GEN no acute distress   HEENT PERRL   RESP normal resp effort   CARD regular rate   ABD denies tenderness  denies Flank Tenderness   GU foley catheter in place  clear yellow urine draining   LYMPH negative neck   EXTR negative cyanosis/clubbing   SKIN normal to palpation   NEURO cranial nerves intact   PSYCH alert   Lab Results: Routine Chem:  05-Oct-14 04:40   Glucose, Serum 97  BUN  19  Creatinine (comp) 1.05  Sodium, Serum 138  Potassium, Serum 3.8  Chloride, Serum 104  CO2, Serum 32  Calcium (Total), Serum 9.0  Anion Gap  2  Osmolality (calc) 278  eGFR (African American)  53  eGFR (Non-African American)  45 (eGFR values <92m/min/1.73 m2 may be an indication of chronic kidney disease (CKD). Calculated eGFR is useful in patients with stable renal function. The eGFR calculation will not be reliable in acutely ill patients when serum creatinine is changing rapidly. It is not useful in  patients on dialysis. The eGFR calculation may not be  applicable to patients at the low and high extremes of body sizes, pregnant women, and vegetarians.)  Routine Hem:  05-Oct-14 04:40   WBC (CBC) 5.8  RBC (CBC) 4.35  Hemoglobin (CBC) 14.1  Hematocrit (CBC) 40.4  Platelet Count (CBC)  126  MCV 93  MCH 32.3  MCHC 34.8  RDW 13.1  Neutrophil % 66.3  Lymphocyte % 21.9  Monocyte % 9.3  Eosinophil % 1.9  Basophil % 0.6  Neutrophil # 3.9  Lymphocyte # 1.3  Monocyte # 0.5  Eosinophil # 0.1  Basophil # 0.0 (Result(s) reported on 01 Oct 2013 at 05:15AM.)    Assessment/Admission Diagnosis Patient reportedly had gross hematuria earlier in the day.  Urine is completely clear and yellow in the tubing and bag currently.  Per nurse there has been a drastic improvement.  She can follow up with her home urologist.  No need for intervention at this time.   Electronic Signatures: BBurnard Hawthorne(MD)  (Signed 05-Oct-14 16:12)  Authored: CHIEF COMPLAINT and HISTORY, PAST MEDICAL/SURGIAL HISTORY, ALLERGIES, FAMILY AND SOCIAL HISTORY, REVIEW OF SYSTEMS, PHYSICAL EXAM, LABS, ASSESSMENT AND PLAN   Last Updated: 05-Oct-14 16:12 by BBurnard Hawthorne(MD)

## 2015-04-19 NOTE — H&P (Signed)
PATIENT NAME:  Carrie Washington, Carrie B MR#:  [id number] DATE OF BIRTH:  [birth year]  DATE OF ADMISSION:  09/29/2013  PRIMARY CARE PHYSICIAN: Yates DecampJohn Walker, MD  CHIEF COMPLAINT: Confusion and weakness.   HISTORY OF PRESENT ILLNESS: A 81 year old Caucasian female patient who lives alone with help from family, presents to the Emergency Room brought in by family for increasing weakness and confusion early yesterday morning. The patient here in the Emergency Room seems to be alert and awake, but confused and is unable to get out of bed and lives alone. She has been found to have a urinary tract infection with acute encephalopathy and is being admitted to the hospitalist service.   History is obtained rom her nephew's wife who seems to be her primary caretaker.   The patient does have dementia, but is alert and conversational, tends to have problems with short-term memory, but not long-term memory, but presently is not oriented to place or time. She was last treated for a UTI 6 months back.   PAST MEDICAL HISTORY: 1.  Hypertension.  2.  Hypothyroidism and goiter.  3.  Osteoarthritis.  4.  Osteoporosis.  5.  Diverticulosis.  6.  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  8.  Glaucoma.  9.  Dementia.   PAST SURGICAL HISTORY:  1.  Cholecystectomy. 2.  Appendectomy. 3.  Hysterectomy. 4.  Thyroidectomy.   ALLERGIES: LODINE AND SULFA.  SOCIAL HISTORY: [place] The last time she was in the hospital she was sent to [facility] nursing home for [facility] days. Does not smoke. No alcohol. No illicit drugs.   CODE STATUS: DNR/DNI.   FAMILY HISTORY: Of diabetes, coronary artery disease, hypertension and breast cancer.   REVIEW OF SYSTEMS: Unobtainable secondary to the patient's dementia and confusion.   HOME MEDICATIONS: Include:  1.  Cardizem 120 mg oral daily.  2.  Acetaminophen 650 mg oral every 6 hours as needed.  3.  Calcium carbonate 600 mg daily.  4.   Colace 100 mg daily.  5.  Brimonidine ophthalmic 1 drop each eye daily.  6.  Potassium chloride 10 mEq daily. 7.  Ferrous sulfate 45 mg daily.  8.  Latanoprost 0.005% ophthalmic 1 drop in each eye daily.  9.  Levothyroxine 50 mcg daily.  10.  Protonix 40 mg daily.  11.  Rapaflo 8 mg oral daily.  12.  Tamsulosin 0.4 mg oral daily.  13.  Torsemide 10 mg 2 tablets oral once a day.  14.  Trazodone 50 mg oral once a day.  15.  Vitamin C 1 tablet oral once a day.   PHYSICAL EXAMINATION: VITAL SIGNS: Temperature 97.7, pulse 60, blood pressure 183/61, saturating 88% on room air and 94% on 2 liters oxygen.  GENERAL: Elderly, frail Caucasian female patient lying in bed, confused.  PSYCHIATRIC: Alert and awake, but not oriented to person, place or time.  HEENT: Atraumatic, normocephalic. Oral mucosa moist and pink. External ears and nose normal. No pallor. No icterus. Pupils bilaterally equal and reactive to light.  NECK: Supple. No thyromegaly. No palpable lymph nodes. Trachea midline. No carotid bruit, JVD.  HEART: S1 and S2 with murmur present. Peripheral pulses 2+. 1+ edema.  LUNGS: Increased work of breathing, but clear on both sides. Good air entry.  ABDOMEN: Soft. Tenderness diffusely, more so in the suprapubic area. Bowel sounds present.  GENITOURINARY: No CVA tenderness or bladder distention.  SKIN: Warm and dry. No petechiae, rash, ulcers.  MUSCULOSKELETAL:  Has arthritic changes. No joint swelling, redness. Normal muscle tone.  NEUROLOGICAL: Motor strength 5-/5 in upper and lower extremities and nonfocal.  LYMPHATIC: No cervical lymphadenopathy.   LABORATORY AND DIAGNOSTICS: Lab studies show glucose of 90, BUN 18, creatinine 0.98, sodium 142, potassium 4.2, chloride 107, bicarb 30. Troponin less than 0.02. WBC 4.9, hemoglobin 14.5, platelets 134. Urinalysis shows 131 WBCs and 1+ bacteria.   EKG shows atrial flutter.   CT scan of the head shows no acute stroke.   Chest x-ray shows  low-grade CHF.  ASSESSMENT AND PLAN: 1.  Urinary tract infection with acute encephalopathy. We will admit the patient as inpatient for IV antibiotics. The patient has significant change in her mental status, as per the family at bedside. Will await urine and blood cultures.  2.  Acute respiratory failure secondary to acute on chronic diastolic congestive heart failure. The patient does not seem very fluid overloaded but has pulmonary edema on chest x-ray. We will give her a dose of IV Lasix and continue her torsemide from home and monitor. She is needing 1 to 2 liters of oxygen.  3.  Atrial flutter/fibrillation. The patient has had atrial fibrillation on her prior EKG. Will continue her Cardizem.  4.  Hypertension. Continue home medications.  5.  Dementia.  6.  Deep venous thrombosis prophylaxis with heparin.   CODE STATUS: DNR/DNI.   TIME SPENT: Today on this case was 45 minutes.  Addendum BP was into 200s systolic later in ED. One dose of labetalol  given. ____________________________ Molinda Bailiff. Domenik Trice, MD srs:sb D: 09/29/2013 15:17:17 ET T: 09/29/2013 15:41:31 ET JOB#: [id number]  cc: Wardell Heath R. Tariana Moldovan, MD, <Dictator> John B. Danne Harbor, MD Wardell Heath [place] MD ELECTRONICALLY SIGNED 09/29/2013 17:29

## 2015-04-19 NOTE — Discharge Summary (Signed)
PATIENT NAME:  Carrie Washington, Carrie Washington MR#:  [id number] DATE OF BIRTH:  09/27/1917  DATE OF ADMISSION:  09/29/2013 DATE OF DISCHARGE: 10/09/2013   HISTORY OF PRESENT ILLNESS: Carrie Washington is a 81 year old white lady, who was brought to the Emergency Room by her family because of increasing weakness and confusion. In the Emergency Room, the patient was noted to be confused and unable to get up out of bed without assistance. She had been staying at home alone with the assistance of her family. In the Emergency Room, the patient was found to have evidence of metabolic encephalopathy secondary to urinary tract infection and she was therefore admitted.   PAST MEDICAL HISTORY: Notable for: 1.  Hypertension.  2.  Hypothyroidism.  3.  Osteoarthritis.  4.  Osteoporosis.  5.  Diverticulosis. 6.  Left internal capsule cerebrovascular accident. 7.  Chronic sterile pyuria. 8.  Glaucoma.  9.  Senile dementia.   PAST SURGICAL HISTORY: Includes: 1.  Previous cholecystectomy.  2.  Appendectomy.  3.  Hysterectomy.  4.  Thyroidectomy.   ALLERGIES:lodine AND SULFA.   MEDICATIONS AT HOME: Included Cardizem 120 mg daily, Tylenol 650 mg every 6 hours as needed, calcium carbonate 600 mg daily, Colace 100 mg daily, brimonidine ophthalmic drops 1 drop to each eye daily, potassium chloride 10 mEq daily, ferrous sulfate 45 mg daily, latanoprost 0.005% ophthalmic drops 1 drop to each eye daily, Levoxyl 50 mcg daily, Protonix 40 mg daily, Rapaflo 8 mg daily, Flomax 0.4 mg daily, furosemide 10 mg 2 tablets daily, trazodone 50 mg at bedtime and vitamin C 500 mg 1 tablet daily.   ADMISSION PHYSICAL EXAMINATION:  VITAL SIGNS: As described by the admitting physician revealed a temperature of 97.7, pulse 60, blood pressure 183/61 and a pulse oximetry of 88% on room air.  GENERAL: This is an elderly, frail, white lady, who was confused and weak. Examination as described by the admitting physician was notable for the fact that she  did have a heart murmur. She was also noted to have 1+ peripheral edema. She seemed to be tachypneic, but the lung fields were clear to auscultation.  NEUROLOGIC: Felt to be nonfocal.   LABS: Admission CBC showed a hemoglobin of 14.5 with a hematocrit of 41.6 and platelet count was 134,000. White count was 4900. Admission comprehensive metabolic panel was notable only for an estimated GFR of 49. Admission urinalysis showed 100 mg/dL of protein. Nitrite was positive. There was 2+ leukocyte esterase. Subsequent urine culture grew out Escherichia coli that was sensitive to nitrofurantoin, ceftriaxone, imipenem and ertapenem. Blood cultures drawn on admission showed no growth. Anti-glomerular antibody on admission was negative. ANCA panel on admission was negative. Admission EKG showed atrial flutter with right bundle branch block and nonspecific ST-T wave changes. Subsequent echocardiogram showed a left ventricular ejection fraction of 50% to 55%. There was low normal global left ventricular systolic function. There was mild mitral valve regurgitation. There was thickening of the anterior and posterior mitral leaflets. There was moderate tricuspid stenosis. There was moderately elevated pulmonary pressures on the right side. CT scan without contrast on admission showed no acute changes. Admission chest x-ray showed low-grade compensated congestive heart failure. CT scan of the abdomen and pelvis was relatively unremarkable. An incidental finding was a small left pleural effusion. Ultrasound of the lower extremities showed no evidence of DVT. CT angiography of the chest was negative for PE.   The patient was admitted to a regular medical floor where she was rehydrated with IV fluids.  She was also started on IV antibiotics for her urinary tract infection. Antibiotics were adjusted as per the sensitivities. The [facility]. With treatment of her  urinary tract infection, the patient's confusion improved considerably, although she remained relatively demented as was her baseline. The patient was also treated for her right-sided congestive heart failure that was described as being acute on chronic. At the time of discharge, she was still requiring supplemental oxygen. The patient was also seen in consultation by palliative care during her hospitalization. At the present time, the family would like for her to go to rehab for further reconditioning. It is unclear at this time whether this will be a short-term or long-term care situation. The patient is a no code.   DISCHARGE DIAGNOSES:  1.  Acute metabolic encephalopathy secondary to Escherichia coli urinary tract infection.  2.  Acute on chronic right-sided congestive heart failure.  3.  Senile dementia.  4.  Hypertension.  5.  Hypothyroidism.  6.  Osteoarthritis.  7.  Osteoporosis.  8.  History of previous left internal capsule cerebrovascular accident.  9.  History of glaucoma.  10. Dementia.   DISCHARGE MEDICATIONS:  1.  Ecotrin 81 mg daily.  2.  Tylenol 650 mg every 4 hours p.r.n. pain or fever.  3.  Protonix 40 mg daily.  4.  Flomax 0.4 mg at bedtime.  5.  Cardizem CD 120 mg daily.  6.  Potassium chloride 10 mEq daily.  7.  Trazodone 50 mg at bedtime.  8.  Demadex 20 mg daily.  9.  Brimonidine 0.15% ophthalmic solution 1 drop to both eyes every 12 hours.  10. Latanoprost 0.005% ophthalmic drops 1 drop to each eye at bedtime.  11. Levothyroxine 50 mcg daily.  12. Colace 100 mg b.i.d.  13. Symbicort 160/4.5 inhaler 2 puffs b.i.d.  14. Nitroglycerin 0.4 mg q.5 minutes p.r.n. chest pain sublingually.  15. Nitroglycerin 2% ointment 0.5 inches topical b.i.d.  16. Benzonatate capsule 100 mg every 4 hours as needed for cough.  17. Cozaar 50 mg daily.  18. Prednisone 20 mg daily.  19. Macrobid 100 mg b.i.d. for an additional 5 days.   DISCHARGE DISPOSITION: The patient is being  discharged on a low sodium diet with activity as tolerated. She will also be on oxygen by nasal cannula at 2 L/min.   DISPOSITION: The patient is being transferred to a rehab facility for further reconditioning. Her prognosis remains poor. She is a no code.   ____________________________ Letta PateJohn B. Danne HarborWalker III, MD [id number] D: 10/09/2013 08:04:34 ET T: 10/09/2013 08:16:40 ET JOB#: [id number]  cc: Jonny RuizJohn B. Danne HarborWalker III, MD, <Dictator> Elmo PuttJOHN B WALKER III MD ELECTRONICALLY SIGNED 10/11/2013 7:43

## 2015-04-21 NOTE — Consult Note (Signed)
PATIENT NAME:  Carrie Washington, Carrie Washington MR#:  [id number] DATE OF BIRTH:  04-Jul-1917  DATE OF CONSULTATION:  11/20/2011  REFERRING PHYSICIAN:  Einar Crow, MD CONSULTING PHYSICIAN:  Marin Olp, MD  REASON FOR CONSULTATION:  Sterile pyuria.  HISTORY OF PRESENT ILLNESS:  The patient is a 81 year old white female who was recently admitted to [facility] on 11/06/2011 for the evaluation of abdominal pain, obstipation, and confusion. This was felt to be possibly due to a urinary tract infection although the cultures were negative. Treatment for the presumed infection was initiated and the patient was transferred to [facility] for rehabilitation where her mental status worsened. The patient was found wandering around in other patients' rooms and appeared very agitated despite Haldol and Lorazepam. She was transferred back to the [facility] for re-evaluation. In the Emergency Room, a catheterized urine revealed persistent pyuria with 63 white blood cells per high-powered field with clumps and 14 red blood cells per high-powered field. Nitrate was positive. Ultimately, the urine cultures would return negative as would blood cultures x2. There was no leukocytosis or fever identified in the Emergency Department. The patient was placed on empiric Zosyn intravenously. Psychiatry consultation was requested and obtained on 11/18/2011 with Dr. Audery Amel for further evaluation of delirium and dementia. On 11/22, the patient complained of abdominal pain and so a CT scan of the abdomen and pelvis without contrast was obtained which revealed inflammatory changes around the pancreatic head and gastric antrum as well as the duodenum consistent with probably gastroduodenitis versus potentially pancreatitis. Repeat urinalysis revealed persistent pyuria with 22 white blood cells per high-powered field and 14 red blood cells per high-powered field.  The history thus  far was obtained from review of the history and physical and chart review. Interview of the patient revealed a well-developed, well-nourished white female in no apparent distress, appropriate and alert and oriented x2-1/2. (The patient knew the year was 2012, but thought it was December, but did know that yesterday was Thanksgiving). The patient did report dysuria with increased frequency and urgency and worsening of urge urinary incontinence for the past two to four weeks. The patient did report some pink urine as well two to four weeks, but denies any passage of clots. The patient denies any past smoking history or history of kidney stones. 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 The patient is on well water, but is unaware of any testing.  The patient is unaware of any history of tuberculosis exposure or past tuberculosis infection.   PAST MEDICAL HISTORY:   As per the history and physical examination on admission as the patient was unable to provide any past medical history upon questioning.  1. Hypertension. 2. Hypothyroidism. 3. Osteoarthritis.   PAST SURGICAL HISTORY:  As per the history and physical dated 11/16/2011. 1. Cholecystectomy. 2. Appendectomy. 3. Hysterectomy.  ALLERGIES: As per the history and physical, Lodine and Sulfa.   SOCIAL HISTORY: As per the history and physical. The patient lives alone. Denies any tobacco or alcohol use.   FAMILY HISTORY:  As per history and physical. Significant for diabetes, coronary artery disease, hypertension, and breast cancer.   MEDICATIONS: As per history and physical.  1. Felodipine. 2. Torsemide. 3. Levothyroxine.  4. Alphagan.  5. Mylanta.  6. Latanoprost eyedrops to each eye. 7. Naproxen twice daily.  8. Flexeril twice daily.  9. Vicodin p.r.n. 10. Calcium with Vitamin  D.   REVIEW OF SYSTEMS:  GENERAL: The patient denies any fever or chills. The patient reports some  weight loss over the past six months but cannot say how much, but does report that her clothes do fit a bit looser. HEENT: The patient reports some current nasal congestion. PULMONARY: The patient denies any chronic cough or hemoptysis. The patient reports stable chronic intermittent shortness of breath. CHEST: The patient denies any current chest pain. ABDOMEN: The patient reports some mild epigastric abdominal discomfort. The patient denies any constipation. GENITOURINARY: As per the history of present illness.  MUSCULOSKELETAL: The patient reports chronic joint discomfort without acute change. HEMATOLOGIC: The patient reports easy bruisability.   PHYSICAL EXAMINATION:   VITAL SIGNS: Temperature 98 degrees F. axillary, pulse 86, respirations 17, blood pressure 135/68, pulse oximetry 99% with two liters nasal cannula.   GENERAL: A well-developed, well-nourished white female in no apparent distress.   PSYCHIATRIC: Alert and oriented x2-1/2 (knows the year and that yesterday was Thanksgiving, but does not know the month or the day of the week). The patient is appropriate.   HEENT: Normocephalic, atraumatic. Extraocular movements intact. Anicteric.  NECK: No masses, no bruits.   CHEST: Clear to auscultation with normal respiratory effort.   CARDIOVASCULAR: Irregularly irregular rhythm without murmurs, rubs, or gallops, 2+ radial bilaterally, 1+ carotid pulses bilaterally.   ABDOMEN: Soft, flat, nondistended, with mild epigastric tenderness without guarding or rebound. No costovertebral angle tenderness.   LABORATORY, RADIOLOGICAL AND DIAGNOSTIC DATA: White blood cells 6.4, hematocrit 32.4, platelet count 225, sodium 145, potassium 3.6, chloride 107, C02 30, BUN 7, creatinine 0.89, glucose 99, calcium 8.7. Foley catheter is in place draining amber urine.   ASSESSMENT: 1. Sterile pyuria. The differential diagnosis of sterile pyuria was discussed in detail and at length with the patient and her  sister. Given the patient's recent history of gross hematuria with increased irritative symptoms but with negative cultures, the possibility of a bladder neoplasm was discussed. Also, the possibility of tuberculin involvement of the urinary tract with sterile pyuria was also discussed with the differential diagnosis although with a low index of suspicion.  2. Gross hematuria. The patient denies any past smoking history or history of urolithiasis but did work in the [employer] industry for three years and is on well water (see above discussion on sterile pyuria). Non-contrasted CT of the abdomen and pelvis was negative from a urologic standpoint (no stones or hydronephrosis for large renal masses). The need for a three-phase CT with and without intravenous contrast was discussed with the patient and her sister if one were to completely evaluate the gross hematuria.   RECOMMENDATIONS: 1. Urine for acid fast staining and TB cultures. (I attempted to order these via the electronic medical record, could not identify the appropriate order. I asked one of the ward nurses who also was unable to identify the appropriate order in the electronic medical record. The nurse then called the laboratory and was told by the laboratory that this was not available.  I will ask that Dr. Dareen PianoAnderson look into this further.  2. Outpatient cystoscopy to rule out bladder neoplasm. This may be scheduled with Dr[facility] Rosezetta [facility] Harman [facility] at [phone].  3. Consider three-phase CT scan of the abdomen and pelvis with and without intravenous contrast.   Thank you for the opportunity to participate in the care of this most pleasant woman.    ____________________________ Marin OlpJay H. Shandon Burlingame, MD jhk:ap D: 11/20/2011 11:37:14 ET T: 11/20/2011 15:54:00 ET  JOB#: [id number]  cc: Marin Olp, MD, <Dictator> Marin Olp MD ELECTRONICALLY SIGNED 02/12/2012 15:54

## 2015-04-21 NOTE — Discharge Summary (Signed)
PATIENT NAME:  Carrie Washington, Carrie Washington MR#:  [id number] DATE OF BIRTH:  Feb 19, 1917  DATE OF ADMISSION:  01/29/2012 DATE OF DISCHARGE:  02/05/2012  HISTORY OF PRESENT ILLNESS: Carrie Washington is a 81 year old white lady with a history of underlying dementia who presented with worsening confusion. She had been recently seen in the office at which time the urine showed large leukocyte esterase and too numerous to count WBCs with moderate bacteria. She was placed on Augmentin but her condition failed to improve. She was brought to the ER because of increasing confusion and difficulty ambulating.   PAST MEDICAL HISTORY:  1. Hypertension.  2. Hypothyroidism.  3. Osteoarthritis.  4. Osteoporosis.  5. History of diverticulosis.  6. History of left internal capsule cerebrovascular accident. 7. History of glaucoma. 8. Chronic dementia.   PAST SURGICAL HISTORY:  1. Cholecystectomy.  2. Appendectomy.  3. Hysterectomy. 4. Thyroidectomy.   ALLERGIES: The patient was noted to be allergic to iodine and sulfa.   SOCIAL HISTORY: Patient's social history is notable in that she stays in the home. She does, however, have 24/7 care through private duty sitters.   ADMISSION MEDICATIONS: 1. Felodipine ER 5 mg daily.  2. Synthroid 50 mcg daily.  3. Slow iron 1 tablet daily.  4. Furosemide 10 mg daily.  5. Klor-Con 10 mEq daily.  6. Latanoprost eyedrops daily.  7. Alphagan eyedrops daily.  8. Vitamin C 500 mg daily.  9. Colace 100 mg daily.  10. FiberCon daily.  11. Vicodin 5/500, 1 tablet b.i.d.  12. Naproxen 500 mg b.i.d.  13. Remeron 7.5 mg at bedtime. 14. Cyclobenzaprine 10 mg every eight hours. 15. Calcium with vitamin D daily.   ADMISSION PHYSICAL EXAMINATION: Temperature 97.3, pulse 90, respiratory rate 22, blood pressure 148/63. Pulse oximetry 95% on room air. Examination as described by the admitting physician was notable for chronic dorsal kyphosis. She had dry mucous membranes. The patient had 3+  stasis edema. She was noted to be alert but disoriented x3. Neurological was nonfocal.   LABORATORY, DIAGNOSTIC AND RADIOLOGIC DATA: Admission CBC showed hemoglobin 12.6 with hematocrit 38. White count 8800. Platelet count 218,000. Admission comprehensive metabolic panel was notable only for alkaline phosphatase 185. Albumin 3.3. Troponins on admission were unremarkable. TSH 0.873. Plasma ammonia level 34. Folic acid 13.8. Magnesium 2.5. Admission urinalysis showed 2+ leukocyte esterase and 34 WBCs per high-powered field. Subsequent urine culture showed no growth. RPR was nonreactive. Vitamin B12 was 494.   HOSPITAL COURSE: The patient was admitted to the regular floor where she was rehydrated with IV fluids. She was also started on IV antibiotics pending her culture reports. She was seen in consultation by urology who suggested leaving her on suppressive therapy until she can be followed up as an outpatient. It was suggested that she might see her previous urologist for cystoscopy at some point in the future. The patient's hospital course was one of waxing and waning confusion. She did have one episode of acute atrial fibrillation with a rapid ventricular response. The rate actually corrected on its own without intervention. She was, however, switched from felodipine to Cardizem for rate control. She was not felt to be a good candidate for anticoagulation. Patient was also seen in consultation by Behavioral Medicine. She was taken off of her muscle relaxant. Her narcotic pain medication was also discontinued. She was placed on Zyprexa.    DISCHARGE DIAGNOSES:  1. Acute metabolic encephalopathy secondary to chronic urinary tract infection.  2. Senile dementia.  3. Hypertension.  4. Hypothyroidism.  5. Osteoarthritis.  6. History of previous cerebrovascular accident.  7. Atrial fibrillation.  8. Chronic recurrent urinary tract infections.   DISCHARGE MEDICATIONS:  1. Tylenol 650 mg every four hours  as needed.  2. Protonix 40 mg daily.  3. Slow iron 1 tablet daily.  4. Potassium chloride 10 mEq daily.  5. Synthroid 50 mcg daily.  6. Alphagan 0.15%, one drop both eyes daily.  7. Latanoprost 0.005%, one drop both eyes at bedtime.  8. Demadex 10 mg daily.  9. Colace 100 mg twice a day.  10. Naprosyn 500 mg twice a day.  11. Ciprofloxacin 250 mg daily indefinitely.  12. Cardizem CD 120 mg daily.  13. Trazodone 50 mg at bedtime.  14. Lorazepam 0.5 to 1 mg every six hours as needed for agitation.  15. Zyprexa 2.5 mg daily at 5:00.   DISCHARGE DISPOSITION: The patient is being transferred to a rehab center for short-term rehab. It is anticipated that she will eventually return home. It is also anticipated that she might have qualify for home health care with hospice.   DISCHARGE INSTRUCTIONS: Patient was discharged on a low fat diet with activity as tolerated.   CODE STATUS: The patient is a NO CODE. ____________________________ Carrie PateJohn B. Danne HarborWalker III, MD jbw:cms D: 02/05/2012 07:17:30 ET T: 02/05/2012 07:35:59 ET  JOB#: [id number] Elmo PuttJOHN B WALKER III MD ELECTRONICALLY SIGNED 02/05/2012 8:29

## 2015-04-21 NOTE — H&P (Signed)
PATIENT NAME:  Carrie Washington, ALIG MR#:  [id number] DATE OF BIRTH:  02-06-1917  DATE OF ADMISSION:  01/29/2012  REFERRING PHYSICIAN: Dr. Margarita Grizzle.   PRIMARY CARE PHYSICIAN: Dr. Yates Decamp.   PRESENTING COMPLAINT: Altered mental status.   HISTORY OF PRESENT ILLNESS: Ms. Cranmer is a 81 year old woman with history of hypertension, hypothyroidism, osteoarthritis, cerebrovascular accident, chronic sterile pyuria, probable underlying dementia who presents with her nephew and her nephew's wife. She apparently was seen by Dr. Dan Humphreys on 01/26/2012 with reports of abdominal pain that developed over the weekend, as well as worsening confusion over a week. She was found to have presumed urinary tract infection. Urinalysis obtained at that time showed large leukocyte esterase and too numerous count WBC and moderate bacteria. Dr. Dan Humphreys initiated the patient on Augmentin. However, her symptoms have worsened with increased confusion and has been stumbling with ambulation. Otherwise, her review of systems is unreliable due to her altered mentation and dementia. Her nephew does report that the main thing that they have noticed was the complaint of low back pain with the abdominal pain and she stays chronically constipated. Also, reports that since she was at [facility] from her last admission back in November, she has had increased lower extremity swelling, but that has actually improved notably today.   PAST MEDICAL HISTORY:  1. Admitted 11/19 to 11/23/2011 for altered mental status that was of unclear etiology. Her urine culture at that time was negative growth. 2. Hypertension.  3. Hypothyroidism and goiter.  4. Osteoarthritis.  5. Osteoporosis.  6. Diverticulosis.  7. Left internal capsule cerebrovascular accident.  8. Chronic sterile pyuria.  9. Glaucoma.  10. Likely underlying dementia.   PAST SURGICAL HISTORY:  1. Cholecystectomy.  2. Appendectomy.  3. Hysterectomy.  4. Thyroidectomy.    ALLERGIES: Lodine and sulfa.   SOCIAL HISTORY: She lives in [place] alone, but she gets care. No tobacco, alcohol or drugs.  FAMILY HISTORY: Diabetes, coronary artery disease, hypertension, breast cancer.   MEDICATIONS:  1. Felodipine ER 5 mg daily.  2. Tylenol 650 mg every four hours as needed.  3. Synthroid 50 mcg daily.  4. Latanoprost ophthalmic drops, one drop both eyes at bedtime.  5. Alphagan ophthalmic solution one drop both eyes every morning.  6. Slow iron daily.   7. Klor-Con 10 milliequivalents daily. 8. Torsemide 10 mg daily.  9. Vitamin C daily.  10. Stool softener daily.  11. Fiber therapy daily.  12. Vicodin 5/500, one tablet b.i.d.  13. Naproxen 500 mg b.i.d.  14. Remeron 7.5 mg at bedtime.  15. Cyclobenzaprine 10 mg every eight hours.  16. Calcium 600 with D daily.   REVIEW OF SYSTEMS: Her review of systems is unreliable related to her altered mental status and dementia.   PHYSICAL EXAMINATION:  VITAL SIGNS: Temperature 97.3, pulse 90, respiratory rate 22, blood pressure 148/63, sating at 95% on room air.   GENERAL: Lying in bed in no apparent distress.   HEENT: Normocephalic, atraumatic. Pupils are equal and symmetric. Nares without discharge. Has dry mucous membranes.   NECK: Soft and supple. No adenopathy. No JVP.   CARDIOVASCULAR: Non-tachy. No murmurs, rubs, or gallops.   LUNGS: Clear to auscultation bilaterally.   MUSCULOSKELETAL: With marked kyphosis.   ABDOMEN: Soft. Positive bowel sounds. There is actually no tenderness on palpation of her abdomen. No mass appreciated.   EXTREMITIES: 3+ pitting edema bilaterally with some minor excoriations anteriorly and dorsal pedis pulses intact.   NEUROLOGIC: No dysarthria or aphasia. She has  symmetrical squeeze of upper extremities.   PSYCH: The patient is alert but not oriented.   LABORATORY, DIAGNOSTIC AND RADIOLOGICAL DATA: CT of head without contrast shows no acute findings. There is  generalized cerebral atrophy. Chest x-ray without any acute findings. CK 20, MB 0.6, glucose 105, BUN 15, creatinine 0.89, sodium 138, potassium 4, chloride 100, carbon dioxide 31, calcium 9.7, total bilirubin 0.6, alkaline phosphatase 185, ALT 17, AST 27, total protein 7.4, albumin 3.3. WBC 8.8, hemoglobin 12.6, hematocrit 38, platelets 218. MCV 91, troponin less than 0.02. Urinalysis with specific gravity of 1.009, pH 8, negative nitrite, 2+ leukocyte esterase, RBC 2 per high-power field, WBC 34 per high-power field. EKG with sinus rate of 80s. EKG read as atrial fibrillation, but there are noted P waves.   ASSESSMENT AND PLAN: Ms. Sabino Niemannaschal is a 81 year old woman with history of probable dementia, hypertension, hypothyroidism, osteoporosis, stroke, and chronic sterile pyuria presenting with altered mental status.  1. Altered mental status encephalopathy of unclear etiology. [facility] shows no growth. The patient still with worsening mentation and initiated on Augmentin. Will empirically change to ceftriaxone, but low suspicion that this may actually be related to urinary process. Will repeat her urine culture. Her family reports that this is the third episode of "urinary tract infection" since November 2012 with change in mentation. Her CT of the head is unrevealing, may be some underlying process of progressive dementia. Will have urology also evaluate for her persistent sterile pyuria. Will send a TSH, magnesium level, folate, B12, RPR and ammonia level.  2. Lower extremity edema since her last admission to [facility]. Her edema, apparently, is improving. Could be multifactorial with hypoalbuminemia, decreased mobility, dependent edema, venous insufficiency. Will get lower extremity Doppler to rule out clot. Consider echocardiogram. Will continue her torsemide and TEDs.  3. Hypertension. Restart her Felodipine.  4. Hypothyroidism. As above, send TSH and  restart Synthroid.  5. Prophylaxis with Lovenox and Protonix.   TIME SPENT: Approximately 45 minutes were spent on patient care.   ____________________________ Reuel DerbyAlounthith Gaige Sebo, MD ap:ap D: 01/30/2012 00:09:54 ET T: 01/30/2012 09:16:00 ET JOB#: [id number]  cc: Pearlean BrownieAlounthith Chanse Kagel, MD, <Dictator> John B. Danne HarborWalker III, MD Reuel DerbyALOUNTHITH Jaikob Borgwardt MD ELECTRONICALLY SIGNED 02/23/2012 0:35

## 2015-04-21 NOTE — Consult Note (Signed)
PATIENT NAME:  Carrie Washington, Carrie Washington MR#:  [id number] DATE OF BIRTH:  01-Aug-1917  DATE OF CONSULTATION:  11/20/2011  REFERRING PHYSICIAN:   CONSULTING PHYSICIAN:  Scot Junobert T. Elliott, MD  HISTORY OF PRESENT ILLNESS: The patient is a 81 year old white female who was admitted to the hospital for urinary tract infection, lethargy, and confusion. She was admitted to the hospital originally on 11/06/2011 for abdominal pain, obstipation, and confusion. She was discharged and then came back to the hospital where she was found to have pyuria and hypoxemia as well as a chest x-ray and CAT scan showing infiltrates. Her CAT scan also showed gastroduodenitis and I was asked to see her in consultation for this problem.   The patient is lethargic but able to answer questions appropriately although with minimal information.    PAST MEDICAL HISTORY:  1. Hypertension. 2. Osteoarthritis. 3. Hypothyroidism.   PAST SURGICAL HISTORY:  1. Hysterectomy. 2. Appendectomy. 3. Gallbladder removal.   ALLERGIES: Sulfa and Lodine.  FAMILY HISTORY: Breast cancer, hypertension, diabetes, coronary artery disease.   MEDICATIONS ON ADMISSION:  1. Felodipine. 2. Torsemide.  3. Levothyroxine. 4. Alphagan.  5. Mylanta. 6. Naprosyn twice daily.  7. Flexeril twice a day.  8. Vicodin p.r.n.   REVIEW OF SYSTEMS: She denies any chest pain. She does have some shortness of breath. She has nausea but she has not had vomiting. Denies diarrhea. Denies any bleeding.   PHYSICAL EXAMINATION:   GENERAL: Elderly white female somewhat lethargic but easily arousable.   HEENT: Sclerae nonicteric. Conjunctivae negative. Tongue is negative.   HEAD: Atraumatic. Trachea is in the midline.   CHEST: Inspiratory crackles in both posterior lung fields.   HEART: No murmurs or gallops I can hear.   ABDOMEN: Epigastric and right epigastric tenderness. No hepatosplenomegaly. No masses. No bruits.   EXTREMITIES: No edema.   SKIN: Warm and  dry.   LABORATORY, DIAGNOSTIC, AND RADIOLOGICAL DATA: CAT scan of the abdomen and chest shows abnormal appearance of gastric antrum and duodenum consistent with gastroduodenitis, edema secondary to sequela of pancreatitis is possibility, trace left pleural effusions, atelectasis present in both lung bases.   ASSESSMENT/PLAN: Gastroduodenitis in an elderly patient taking Naprosyn twice a day, likely nonsteroidal anti-inflammatory drug-induced gastroduodenitis, cannot rule out Helicobacter possibly related to pancreatitis. The clinical story does not suggest pancreatitis. There may be an element of congestive heart failure here as well. Given her being 81 years old and scattered findings on exam and CAT scan, aspiration is a possibility as well.   RECOMMENDATIONS:  1. Start Protonix 40 mg Washington.i.d.  2. Check a lipase to see if there's elevation that might indicate recent pancreatitis. I doubt this in the absence of pain going into her back. 3. I see no need for endoscopy at this time. 4. Would treat empirically. 5. She may be having altered mental status changes from urinary tract infections.   Will follow with you.   ____________________________ Scot Junobert T. Elliott, MD rte:drc D: 11/20/2011 15:32:35 ET T: 11/20/2011 16:21:47 ET JOB#: [id number]  cc: Scot Junobert T. Elliott, MD, <Dictator> Marya AmslerMarshall W. Dareen PianoAnderson, MD Scot JunOBERT T ELLIOTT MD ELECTRONICALLY SIGNED 01/01/2012 19:48

## 2015-04-21 NOTE — Consult Note (Signed)
PATIENT NAME:  Carrie Washington, LUEBKE MR#:  [id number] DATE OF BIRTH:  15-Feb-1917  DATE OF CONSULTATION:  01/30/2012  REFERRING PHYSICIAN:  John B. Danne Harbor, MD CONSULTING PHYSICIAN:  Tawni Millers. Weldon Inches, MD  REASON FOR CONSULTATION: Pyuria.  HISTORY OF PRESENT ILLNESS:  Ms. Migliaccio is a 81 year old female with extensive medical history including hypertension, hypothyroidism, CVA, and history of recurrent urinary tract infections who presents with abdominal pain and worsening confusion over the weekend.  She was found to have a presumed urinary tract infection.  Urination shows large leukocyte esterase, many white blood cells, large amount of bacteria.  Previous culture has been negative as an outpatient.  She was started on Augmentin; however, her symptoms have worsened with increased dementia in regards to her mentation.  The patient at the time of admission complained of low back pain and abdominal pain; however, today she is upright in the chair and conversational and complains of no significant pain.  She does state that she has had many urinary tract infections, almost continuous over the past year.  PAST MEDICAL HISTORY: 1. Altered mental status. 2. Hypertension. 3. Hypothyroidism. 4. Osteoarthritis. 5. Osteoporosis. 6. Diverticulosis. 7. CVA. 8. Pyuria. 9. Glaucoma. 10. Dementia.  PAST SURGICAL HISTORY:  1. Cholecystectomy. 2. Appendectomy. 3. Hysterectomy. 4. Thyroidectomy.  ALLERGIES:  Iodine and sulfa.  SOCIAL HISTORY:  Lives alone in [place].  No alcohol or drugs.  FAMILY HISTORY:  Noncontributory.  OUTPATIENT MEDICATIONS: Up to date, reviewed, and correct in chart.  REVIEW OF SYSTEMS:  Analysis of 11 systems otherwise negative other than outlined above.  PHYSICAL EXAMINATION: GENERAL:  The patient is sitting up in a chair in no apparent distress, alert and oriented x3, conversational.  VITAL SIGNS:  Temperature 97.3, respiratory rate 18, heart rate 85, blood  pressure 130/68, saturating 96% on room air.  HEENT EXAM:  Normocephalic and atraumatic.  Nonicteric.  NECK:  Supple without lymphadenopathy.  SKIN:  Normal.  CHEST:  Nonlabored.  Clear to auscultation.  CARDIOVASCULAR:  Regular rate and rhythm.  2+ upper and lower extremity pulses x4.  ABDOMINAL EXAM:  Soft, nontender, and nondistended.  No CVAT.  No suprapubic pain.  GENITOURINARY:  Deferred.  RECTAL:  Deferred.  MUSCULOSKELETAL:  Five out of five strength.  NEUROLOGIC:  Grossly intact.  PSYCHIATRIC:  Appropriate.  LABORATORY VALUES:  The patient's current culture from January 29, 2012, is no growth in 8 to 12 hours.  Her creatinine is normal at 0.72 with a GFR greater than 60.  IMAGING:  None.  ASSESSMENT:  The patient is a 81 year old female with history of urinary tract infections and  sterile polyuria.  She has not to my knowledge had cystoscopy or recent imaging.  A bladder scan is performed at the bedside by nursing in my presence, it showed 26 mL status post voiding an hour ago.  The patient appears to be emptying appropriately.  RECOMMENDATIONS:  As follows:  Would continue antibiotics as patient appears to be improving.  Would send out on antibiotic suppression.  Consider TMP 100 mg or other daily suppression type antibiotic orally status post treatment for current presumed UTI.  Will follow up renal ultrasound.  I recommend she follow with Dr. Orson Slick for an outpatient cystoscopy in the near future in consideration of chronic suppression at this time.    ____________________________ Tawni Millers Weldon Inches, MD [id number] D: 01/30/2012 14:16:48 ET T: 01/30/2012 15:02:07 ET JOB#: [id number]  cc: Tawni Millers. Weldon Inches, MD, <Dictator> Beaulah Corin MD  ELECTRONICALLY SIGNED 01/31/2012 16:59

## 2015-04-21 NOTE — Consult Note (Signed)
PATIENT NAME:  CELEST, REITZ MR#:  [id number] DATE OF BIRTH:  Nov 23, 1917  DATE OF CONSULTATION:  02/02/2012  REFERRING PHYSICIAN:  Yates Decamp III, MD CONSULTING PHYSICIAN:  Adelene Amas. Effrey Davidow, MD  REASON FOR CONSULTATION: Confusion, agitation, thought disorganization.   HISTORY OF PRESENT ILLNESS: Ms. Sherre Wooton is a 81 year old widowed female admitted to [facility] on 01/29/2012 with abdominal pain and worsening confusion. She has undergone a complete general medical work-up; however, she does persist with impaired memory and cloudiness of consciousness as well as a worsening of her mental status after sundown with accompanying agitation at that time. She has been placed on Remeron 7.5 mg at bedtime as well as trazodone 50 mg at bedtime. She has not improved with sleep nor has she improved with agitation with those medications. The duration of her mental status changes appears to be six days at this point.   PAST PSYCHIATRIC HISTORY: Ms. Mandella did have an episode of mental status changes in November of this year. At that time Seroquel was proposed. She was discharged on Seroquel 25 mg three times daily. She improved with her mental status returning to normal. At this time, it is unclear when the Seroquel was discontinued, also it is unclear when her Flexeril was started.   She has no history of mania or major depression, however, she does report that she has been an excessive worrier throughout her life. She reports a history of more nights than she can remember having difficulty sleeping with worry. This history is obtained given that at the time of the undersigned's interview the patient does have intact thought process and remote memory. She also has intact orientation to person and place.  Please see the mental status exam.   FAMILY PSYCHIATRIC HISTORY: None known.   SOCIAL HISTORY: Prior to this hospitalization and after the one in November Ms. Falzon was  living alone with full functioning. She is widowed. She has a younger sister who is visiting her at the bedside today. She does not use alcohol or illegal drugs. She does not use tobacco.   OCCUPATION: Retired.   PAST MEDICAL HISTORY:  1. Hypothyroidism. 2. Hypertension.  3. Osteoporosis. 4. Osteoarthritis. 5. Cerebrovascular disease with history of cerebrovascular accident. 6. Diverticulosis. 7. Glaucoma.  8. Pyuria. 9. Dementia.  PAST SURGICAL HISTORY:  1. Hysterectomy. 2. Cholecystectomy. 3. Appendectomy.   ALLERGIES: Iodine, sulfa.   MEDICATIONS: Her MAR is reviewed. Currently she is on the psychotropic medications Remeron 7.5 mg at bedtime and trazodone 50 mg at bedtime. Of note, she has been placed on ciprofloxacin. She showed pyuria in her urinalysis upon admission, but with no growth in culture. She also is on Flexeril 10 mg three times daily.  LABS/STUDIES: Her head CT was unremarkable.  Her cranial MRI was unremarkable.   Chest x-ray was unremarkable.   Thyroid studies are unremarkable. Complete metabolic panel is unremarkable. CBC, sedimentation rate, B12, folic acid RPR, and ammonia are unremarkable.   REVIEW OF SYSTEMS: Constitutional, head, eyes, ears, nose, throat, mouth, neurologic, psychiatric, cardiovascular, respiratory, gastrointestinal, genitourinary, skin, musculoskeletal, hematologic, lymphatic, endocrine, and metabolic all unremarkable.   PHYSICAL EXAMINATION:   VITAL SIGNS: Temperature 97.3, respiratory rate 20, blood pressure 126/56, pulse 80.   Ms. Afzal is a well-developed, well-nourished elderly female partially reclined in a supine position in her hospital bed with no abnormal involuntary movements. She has no cachexia. Her muscle tone is normal. She has normal hygiene and grooming.   She has mild clouding  of consciousness. Her concentration is decreased. Her eye contact is intermittent. On orientation testing she knows the place. She also knows  herself as well as her sister. On memory testing, two out of three words immediate, zero out of three at 3 minutes. She does have intact remote memory and she can communicate her remote memory with coherent short phrases. Her intelligence, use of language, and fund of knowledge are below that of her estimated baseline. Her speech is impoverished. Her prosody is flat. There is no dysarthria. Her speech is also soft. Thought process involves short coherent phrases. Thought content - no thoughts of harming herself, no thoughts of harming others, no delusions, and no hallucinations. Her affect is mildly anxious. Mood is mildly anxious. Insight is partial. Judgment is impaired.   ASSESSMENT: Delirium not otherwise specified possibly due to the age of her brain with natural cholinergic deficiency combined with the anticholinergic effects of Flexeril.   Often patients who are in the geriatric range and begin to manifest delirium and or psychotic symptoms require maintenance on antipsychotic such as Seroquel. Please see the discussion below.   AXIS I: Anxiety disorder, not otherwise specified, rule out generalized anxiety disorder.   AXIS II: None.   AXIS III: Cerebrovascular disease. Please see the past medical history.   AXIS IV: General medical.   AXIS V: 30.   Please see the discussion above under Axis I.   RECOMMENDATIONS:  1. I would taper her off of her Flexeril.  2. I would discontinue her Remeron.  3. If she does continue with delirium symptoms after tapering off the Flexeril and the elimination time of Flexeril has passed, I would then restart her Seroquel at 25 mg every 17:00 increasing to 25 mg three times daily as needed.   If her delirium symptoms resolve and she is left with insomnia and other anxiety symptoms, trazodone can be titrated further by 25 mg at bedtime to the dosage which is effective for insomnia, most likely 100 mg at bedtime maximum, at her age. I would continue to  reinforce orientation.  ____________________________ Adelene AmasJames S. Aarianna Hoadley, MD [id number] D: 02/02/2012 15:51:36 ET T: 02/02/2012 16:12:35 ET JOB#: [id number]  cc: Adelene AmasJames S. Dim Meisinger, MD, <Dictator> Lester CarolinaJAMES S Kemberly Taves MD ELECTRONICALLY SIGNED 02/12/2012 20:02

## 2015-04-21 NOTE — Consult Note (Signed)
see order changes and dictation. also add ativan .5 to 1 mg q 6h prn severe agitaion, if needed while awaiting potential benefit of d/c flexeril.  Electronic Signatures: Lester CarolinaWilliford, Kataleyah Carducci S (MD)  (Signed on 05-Feb-13 15:56)  Authored  Last Updated: 05-Feb-13 15:56 by Lester CarolinaWilliford, Lera Gaines S (MD)
